# Patient Record
Sex: Male | Born: 1965
Health system: Southern US, Community
[De-identification: ages and names within clinical notes are randomized; demographics above are authoritative.]

## PROBLEM LIST (undated history)

## (undated) DIAGNOSIS — I10 Essential (primary) hypertension: Secondary | ICD-10-CM

---

## 2003-05-06 ENCOUNTER — Encounter (INDEPENDENT_AMBULATORY_CARE_PROVIDER_SITE_OTHER): Payer: Self-pay | Admitting: Specialist

## 2003-05-06 ENCOUNTER — Ambulatory Visit (HOSPITAL_COMMUNITY): Admission: RE | Admit: 2003-05-06 | Discharge: 2003-05-06 | Payer: Self-pay | Admitting: Gastroenterology

## 2003-06-29 ENCOUNTER — Emergency Department (HOSPITAL_COMMUNITY): Admission: EM | Admit: 2003-06-29 | Discharge: 2003-06-29 | Payer: Self-pay | Admitting: Emergency Medicine

## 2009-09-11 ENCOUNTER — Emergency Department (HOSPITAL_COMMUNITY)
Admission: EM | Admit: 2009-09-11 | Discharge: 2009-09-11 | Payer: Self-pay | Source: Home / Self Care | Admitting: Emergency Medicine

## 2010-09-08 NOTE — Op Note (Signed)
[name], Russell English                             ACCOUNT NO.:  [id number]   MEDICAL RECORD NO.:  [id number]                   PATIENT TYPE:  AMB   LOCATION:  ENDO                                 FACILITY:  [facility]   PHYSICIAN:  Petra Kuba, M.D.                 DATE OF BIRTH:  [birth year]   DATE OF PROCEDURE:  DATE OF DISCHARGE:                                 OPERATIVE REPORT   PROCEDURE:  Esophagogastroduodenoscopy with biopsy.   INDICATIONS FOR PROCEDURE:  A patient with abdominal pain, highly abnormal  upper GI want to confirm endoscopically.   Consent was signed after risks, benefits, methods, and options were  thoroughly discussed in the office with both the patient and his wife.   MEDICINES USED:  Demerol 50, Versed 6.   DESCRIPTION OF PROCEDURE:  The video endoscope was inserted by direct  vision.  The esophagus was normal.  In the distal esophagus was a small  hiatal hernia.  The scope passed into the stomach, advanced through a normal  pylorus into an essentially normal duodenal bulb around the C loop into a  normal second portion of the duodenum except for maybe some minimal  duodenitis.  The scope was slowly withdrawn back to the bulb and again other  than some very minimal inflammation, no abnormality was seen. The scope was  withdrawn back to the stomach and retroflexed. The cardia, fundus,  angularis, lesser and greater curve were normal except for some minimal  gastritis.  Straight visualization of the stomach confirmed some minimal  gastritis, ruled out any other additional findings.  A few biopsies of the  antrum and a few of the lesser and greater curve were obtained to rule out  helicobacter or other microscopic abnormalities.  We went ahead and  readvanced into the second portion of the duodenum, no additional findings  were seen. At this juncture, we took two biopsies of the second portion of  the duodenum to rule out any microscopic abnormalities, air was  suctioned,  scope slowly withdrawn. Again a good look at the esophagus on slow  withdrawal was normal, scope was removed. The patient tolerated the  procedure well. There was no obvious or immediate complication.   ENDOSCOPIC DIAGNOSIS:  1. Small hiatal hernia.  2. Minimal gastritis, duodenitis status post biopsies of both.  3. Otherwise within normal limits esophagogastroduodenoscopy.   PLAN:  Await pathology, continue pump inhibitors, okay to stop Carafate.  Followup p.r.n. or in two months. Recheck symptoms and make sure no further  workup plans are needed.  One might consider a small bowel follow through or  CAT scan next depending on his symptoms, although I do think the patient is  better and certainly his exam today is fine.  Petra Kuba, M.D.    MEM/MEDQ  D:  05/06/2003  T:  05/06/2003  Job:  [id number]   cc:   Caryn Bee L. Little, M.D.  [address]  [place]  [place] [address]  Fax: [phone]

## 2011-04-21 ENCOUNTER — Ambulatory Visit (INDEPENDENT_AMBULATORY_CARE_PROVIDER_SITE_OTHER): Payer: 59

## 2011-04-21 DIAGNOSIS — Z23 Encounter for immunization: Secondary | ICD-10-CM

## 2011-04-21 DIAGNOSIS — K602 Anal fissure, unspecified: Secondary | ICD-10-CM

## 2011-04-21 DIAGNOSIS — K6289 Other specified diseases of anus and rectum: Secondary | ICD-10-CM

## 2011-12-22 ENCOUNTER — Ambulatory Visit (INDEPENDENT_AMBULATORY_CARE_PROVIDER_SITE_OTHER): Payer: 59 | Admitting: Family Medicine

## 2011-12-22 VITALS — BP 142/76 | HR 52 | Temp 98.5°F | Resp 16 | Ht 66.0 in | Wt 165.4 lb

## 2011-12-22 DIAGNOSIS — R5383 Other fatigue: Secondary | ICD-10-CM

## 2011-12-22 DIAGNOSIS — R51 Headache: Secondary | ICD-10-CM

## 2011-12-22 DIAGNOSIS — R5381 Other malaise: Secondary | ICD-10-CM

## 2011-12-22 LAB — POCT CBC
Granulocyte percent: 34.5 %G — AB (ref 37–80)
HCT, POC: 46.6 % (ref 43.5–53.7)
Hemoglobin: 14.2 g/dL (ref 14.1–18.1)
Lymph, poc: 4 — AB (ref 0.6–3.4)
MCH, POC: 30 pg (ref 27–31.2)
MCHC: 30.5 g/dL — AB (ref 31.8–35.4)
MCV: 98.5 fL — AB (ref 80–97)
MID (cbc): 0.7 (ref 0–0.9)
MPV: 8.6 fL (ref 0–99.8)
POC Granulocyte: 2.5 (ref 2–6.9)
POC LYMPH PERCENT: 55.5 %L — AB (ref 10–50)
POC MID %: 10 %M (ref 0–12)
Platelet Count, POC: 343 10*3/uL (ref 142–424)
RBC: 4.73 M/uL (ref 4.69–6.13)
RDW, POC: 13.6 %
WBC: 7.2 10*3/uL (ref 4.6–10.2)

## 2011-12-22 LAB — GLUCOSE, POCT (MANUAL RESULT ENTRY): POC Glucose: 86 mg/dl (ref 70–99)

## 2011-12-22 NOTE — Progress Notes (Signed)
  Subjective:    Patient ID: Russell English, male    DOB: 01-Feb-1966, 46 y.o.   MRN: [id number]  HPI Patient presents with one day history of fatigue and dizziness. Working 10-12 hour shifts five to six days a week at [employer]. He has been fatigued after work. He is from [place], speaks good [place]. He is here with his wife.   No cough, fever, sorethroat, abdominal symptoms or trauma  Review of Systems Negative as noted.    Objective:   Physical Exam Well developed and well nourished male in no acute distress. Lungs clear bilaterally abdomen soft and non tender. Lower extremities not swollen. Heart:  Regular, no murmur  Abdomen:  Soft, nontender without HSM or mass Extrem:  Normal distal pulses, no edema Skin:  clear Seen with wife who is concerned about him Results for orders placed in visit on 12/22/11  POCT CBC      Component Value Range   WBC 7.2  4.6 - 10.2 K/uL   Lymph, poc 4.0 (*) 0.6 - 3.4   POC LYMPH PERCENT 55.5 (*) 10 - 50 %L   MID (cbc) 0.7  0 - 0.9   POC MID % 10.0  0 - 12 %M   POC Granulocyte 2.5  2 - 6.9   Granulocyte percent 34.5 (*) 37 - 80 %G   RBC 4.73  4.69 - 6.13 M/uL   Hemoglobin 14.2  14.1 - 18.1 g/dL   HCT, POC 40.9  81.1 - 53.7 %   MCV 98.5 (*) 80 - 97 fL   MCH, POC 30.0  27 - 31.2 pg   MCHC 30.5 (*) 31.8 - 35.4 g/dL   RDW, POC 91.4     Platelet Count, POC 343  142 - 424 K/uL   MPV 8.6  0 - 99.8 fL  GLUCOSE, POCT (MANUAL RESULT ENTRY)      Component Value Range   POC Glucose 86  70 - 99 mg/dl        Assessment & Plan:  Tired from working too many hours.

## 2012-04-22 ENCOUNTER — Ambulatory Visit (INDEPENDENT_AMBULATORY_CARE_PROVIDER_SITE_OTHER): Payer: 59 | Admitting: Family Medicine

## 2012-04-22 VITALS — BP 152/88 | HR 83 | Temp 99.0°F | Resp 16 | Ht 67.0 in | Wt 170.8 lb

## 2012-04-22 DIAGNOSIS — D239 Other benign neoplasm of skin, unspecified: Secondary | ICD-10-CM

## 2012-04-22 DIAGNOSIS — D229 Melanocytic nevi, unspecified: Secondary | ICD-10-CM

## 2012-04-22 DIAGNOSIS — Z720 Tobacco use: Secondary | ICD-10-CM

## 2012-04-22 DIAGNOSIS — I10 Essential (primary) hypertension: Secondary | ICD-10-CM

## 2012-04-22 MED ORDER — HYDROCHLOROTHIAZIDE 25 MG PO TABS: 12.5000 mg | ORAL_TABLET | Freq: Every day | ORAL | Status: DC

## 2012-04-22 NOTE — Progress Notes (Signed)
Subjective: Patient is here for a couple of issues. His blood pressure was high somewhere couple of months ago when get checked he wants that rechecked. He has a mole left side of his scalp he wants to get off some time.  Objective: Chest clear heart regular without murmurs blood pressure is noted as a 8 mm diameter nevus on the left side of his scalp/temple area that is totally benign. This is not something that can be frozen off.  Assessment: Nevus on scalp Hypertension Tobacco use  Plan: Did have a long discussion about cigarettes with both him and his wife. hydrochlorothiazide 12.5 mg daily She is to call back sometime and asked that I can meet him at 104 to do a shave biopsy and cautery of that nevus.

## 2012-04-22 NOTE — Patient Instructions (Signed)
Avoid excessive salt  Drink plenty of fluids  Call and leave a message for me to schedule a time that I can't take the skin lesions off your temple

## 2015-05-24 ENCOUNTER — Ambulatory Visit (INDEPENDENT_AMBULATORY_CARE_PROVIDER_SITE_OTHER): Payer: 59 | Admitting: Internal Medicine

## 2015-05-24 ENCOUNTER — Encounter: Payer: Self-pay | Admitting: Physician Assistant

## 2015-05-24 VITALS — BP 138/80 | HR 93 | Temp 98.6°F | Resp 16 | Ht 66.0 in | Wt 172.0 lb

## 2015-05-24 DIAGNOSIS — R509 Fever, unspecified: Secondary | ICD-10-CM

## 2015-05-24 DIAGNOSIS — D72829 Elevated white blood cell count, unspecified: Secondary | ICD-10-CM

## 2015-05-24 DIAGNOSIS — R6889 Other general symptoms and signs: Secondary | ICD-10-CM | POA: Diagnosis not present

## 2015-05-24 LAB — POCT CBC
GRANULOCYTE PERCENT: 79 % (ref 37–80)
HEMATOCRIT: 41.1 % — AB (ref 43.5–53.7)
HEMOGLOBIN: 14.4 g/dL (ref 14.1–18.1)
LYMPH, POC: 2.7 (ref 0.6–3.4)
MCH, POC: 32.5 pg — AB (ref 27–31.2)
MCHC: 34.9 g/dL (ref 31.8–35.4)
MCV: 93 fL (ref 80–97)
MID (cbc): 0.6 (ref 0–0.9)
MPV: 7.1 fL (ref 0–99.8)
PLATELET COUNT, POC: 306 10*3/uL (ref 142–424)
POC GRANULOCYTE: 12.5 — AB (ref 2–6.9)
POC LYMPH PERCENT: 17.3 %L (ref 10–50)
POC MID %: 3.7 %M (ref 0–12)
RBC: 4.42 M/uL — AB (ref 4.69–6.13)
RDW, POC: 13.7 %
WBC: 15.8 10*3/uL — AB (ref 4.6–10.2)

## 2015-05-24 LAB — POCT INFLUENZA A/B
Influenza A, POC: NEGATIVE
Influenza B, POC: NEGATIVE

## 2015-05-24 MED ORDER — HYDROCOD POLST-CPM POLST ER 10-8 MG/5ML PO SUER
5.0000 mL | Freq: Two times a day (BID) | ORAL | Status: DC | PRN
Start: 2015-05-24 — End: 2018-03-26

## 2015-05-24 MED ORDER — AZITHROMYCIN 250 MG PO TABS: ORAL_TABLET | ORAL | Status: AC

## 2015-05-24 MED ORDER — IPRATROPIUM BROMIDE 0.03 % NA SOLN: 2.0000 | Freq: Two times a day (BID) | NASAL | Status: DC

## 2015-05-24 NOTE — Patient Instructions (Signed)
Take antibiotic as directed. Drink plenty of water (64 oz/day) and get plenty of rest. If you have been prescribed a cough syrup, do not drive or operate heavy machinery while using this medication. If you have been prescribed a nasal spray, use twice a day. If your symptoms are not improving in 1 week, return to clinic.

## 2015-05-24 NOTE — Progress Notes (Signed)
[facility] [address], [place] [address] [phone]  Date:  05/24/2015   Name:  Russell English   DOB:  [birth year]   MRN:  [id number]  PCP:  No primary care provider on file.    Chief Complaint: Fever; Generalized Body Aches; Sore Throat; and Cough   History of Present Illness:  This is a 50 y.o. male with PMH HTN who is presenting with fever, body aches, headache, cough, sore throat, otalgia x 4 days. Cough is dry. No sob or wheezing. Checking temp at home and high of 102. Last took ibuprofen 3 hours ago and temp now 101.9.  Aggravating/alleviating factors: taking ibuprofen and theraflu History of asthma: no History of env allergies: yes Tobacco use: 1 ppd Did not get flu shot this year.  Review of Systems:  Review of Systems See HPI  There are no active problems to display for this patient.   Prior to Admission medications   Medication Sig Start Date End Date Taking? Authorizing Provider  hydrochlorothiazide (HYDRODIURIL) 25 MG tablet Take 0.5 tablets (12.5 mg total) by mouth daily. 04/22/12  Yes Posey Boyer, MD    No Known Allergies  History reviewed. No pertinent past surgical history.  Social History  Substance Use Topics  . Smoking status: Current Every Day Smoker  . Smokeless tobacco: Never Used  . Alcohol Use: No    History reviewed. No pertinent family history.  Medication list has been reviewed and updated.  Physical Examination:  Physical Exam  Constitutional: He is oriented to person, place, and time. He appears well-developed and well-nourished. No distress.  HENT:  Head: Normocephalic and atraumatic.  Right Ear: Hearing, tympanic membrane, external ear and ear canal normal.  Left Ear: Hearing, tympanic membrane, external ear and ear canal normal.  Nose: Nose normal.  Mouth/Throat: Uvula is midline and mucous membranes are normal. Posterior oropharyngeal erythema present. No oropharyngeal exudate or posterior  oropharyngeal edema.  Eyes: Conjunctivae and lids are normal. Right eye exhibits no discharge. Left eye exhibits no discharge. No scleral icterus.  Cardiovascular: Normal rate, regular rhythm, normal heart sounds and normal pulses.   No murmur heard. Pulmonary/Chest: Effort normal and breath sounds normal. No respiratory distress. He has no wheezes. He has no rhonchi. He has no rales.  Musculoskeletal: Normal range of motion.  Lymphadenopathy:       Head (right side): No submental, no submandibular and no tonsillar adenopathy present.       Head (left side): No submental, no submandibular and no tonsillar adenopathy present.    He has no cervical adenopathy.  Neurological: He is alert and oriented to person, place, and time.  Skin: Skin is warm, dry and intact. No lesion and no rash noted.  Psychiatric: He has a normal mood and affect. His speech is normal and behavior is normal. Thought content normal.    BP 138/80 mmHg  Pulse 93  Temp(Src) 101.9 F (38.8 C) (Oral)  Resp 16  Ht 5\' 6"  (1.676 m)  Wt 172 lb (78.019 kg)  BMI 27.77 kg/m2  SpO2 98%  Temp came down to 98.6 thirty minutes after given 1 gm tylenol  Results for orders placed or performed in visit on 05/24/15  POCT Influenza A/B  Result Value Ref Range   Influenza A, POC Negative Negative   Influenza B, POC Negative Negative  POCT CBC  Result Value Ref Range   WBC 15.8 (A) 4.6 - 10.2 K/uL   Lymph, poc 2.7  0.6 - 3.4   POC LYMPH PERCENT 17.3 10 - 50 %L   MID (cbc) 0.6 0 - 0.9   POC MID % 3.7 0 - 12 %M   POC Granulocyte 12.5 (A) 2 - 6.9   Granulocyte percent 79.0 37 - 80 %G   RBC 4.42 (A) 4.69 - 6.13 M/uL   Hemoglobin 14.4 14.1 - 18.1 g/dL   HCT, POC 41.1 (A) 43.5 - 53.7 %   MCV 93.0 80 - 97 fL   MCH, POC 32.5 (A) 27 - 31.2 pg   MCHC 34.9 31.8 - 35.4 g/dL   RDW, POC 13.7 %   Platelet Count, POC 306 142 - 424 K/uL   MPV 7.1 0 - 99.8 fL    Assessment and Plan:  1. Flu-like symptoms 2. Fever, unspecified 3.  leukocytosis Flu negative. Leukocytosis possibly d/t reactive process but will cover with zpak. Otherwise, focus is on supportive care, see meds prescribed below. Return in 1 if symptoms do not improve or at any time if symptoms worsen. Return if still have high fevers in 72 hours.  - ipratropium (ATROVENT) 0.03 % nasal spray; Place 2 sprays into both nostrils 2 (two) times daily.  Dispense: 30 mL; Refill: 0 - chlorpheniramine-HYDROcodone (TUSSIONEX PENNKINETIC ER) 10-8 MG/5ML SUER; Take 5 mLs by mouth every 12 (twelve) hours as needed for cough.  Dispense: 100 mL; Refill: 0 - POCT Influenza A/B - POCT CBC - azithromycin (ZITHROMAX) 250 MG tablet; Take 2 tabs PO x 1 dose, then 1 tab PO QD x 4 days  Dispense: 6 tablet; Refill: 0  Discussed case with Dr. Awanda Mink V. Drenda Freeze, MHS [facility]  05/27/2015 I have participated in the care of this patient with the Advanced Practice Provider and agree with Diagnosis and Plan as documented. Robert P. Laney Pastor, M.D.

## 2017-03-13 ENCOUNTER — Encounter: Payer: Self-pay | Admitting: Emergency Medicine

## 2017-03-13 ENCOUNTER — Ambulatory Visit (INDEPENDENT_AMBULATORY_CARE_PROVIDER_SITE_OTHER): Payer: 59 | Admitting: Emergency Medicine

## 2017-03-13 VITALS — BP 132/80 | HR 75 | Temp 98.9°F | Resp 17 | Ht 66.5 in | Wt 165.0 lb

## 2017-03-13 DIAGNOSIS — I10 Essential (primary) hypertension: Secondary | ICD-10-CM | POA: Diagnosis not present

## 2017-03-13 DIAGNOSIS — J069 Acute upper respiratory infection, unspecified: Secondary | ICD-10-CM | POA: Diagnosis not present

## 2017-03-13 DIAGNOSIS — R05 Cough: Secondary | ICD-10-CM

## 2017-03-13 DIAGNOSIS — R059 Cough, unspecified: Secondary | ICD-10-CM | POA: Insufficient documentation

## 2017-03-13 NOTE — Progress Notes (Signed)
Russell English 51 y.o.   Chief Complaint  Patient presents with  . Cough  . URI    HISTORY OF PRESENT ILLNESS: This is a 51 y.o. male complaining of 3-4 day h/o cough and congestion.  Cough  This is a new problem. The current episode started in the past 7 days. The problem has been waxing and waning. The problem occurs every few minutes. The cough is non-productive. Associated symptoms include nasal congestion. Pertinent negatives include no chest pain, ear pain, eye redness, fever, heartburn, hemoptysis, myalgias, rash, rhinorrhea, sore throat, shortness of breath, weight loss or wheezing.  URI   This is a new problem. The current episode started in the past 7 days. The problem has been waxing and waning. There has been no fever. Associated symptoms include congestion and coughing. Pertinent negatives include no chest pain, dysuria, ear pain, rash, rhinorrhea, sore throat or wheezing. He has tried nothing for the symptoms.     Prior to Admission medications   Medication Sig Start Date End Date Taking? Authorizing Provider  chlorpheniramine-HYDROcodone (TUSSIONEX PENNKINETIC ER) 10-8 MG/5ML SUER Take 5 mLs by mouth every 12 (twelve) hours as needed for cough. 05/24/15  Yes Ezekiel Slocumb, PA-C  hydrochlorothiazide (HYDRODIURIL) 25 MG tablet Take 0.5 tablets (12.5 mg total) by mouth daily. 04/22/12  Yes Posey Boyer, MD  ipratropium (ATROVENT) 0.03 % nasal spray Place 2 sprays into both nostrils 2 (two) times daily. 05/24/15  Yes Ezekiel Slocumb, PA-C    No Known Allergies  There are no active problems to display for this patient.   No past medical history on file.  No past surgical history on file.  Social History   Socioeconomic History  . Marital status: Single    Spouse name: Not on file  . Number of children: Not on file  . Years of education: Not on file  . Highest education level: Not on file  Social Needs  . Financial resource strain: Not on file  . Food insecurity -  worry: Not on file  . Food insecurity - inability: Not on file  . Transportation needs - medical: Not on file  . Transportation needs - non-medical: Not on file  Occupational History  . Not on file  Tobacco Use  . Smoking status: Current Every Day Smoker  . Smokeless tobacco: Never Used  Substance and Sexual Activity  . Alcohol use: No    Alcohol/week: 0.0 oz  . Drug use: No  . Sexual activity: No  Other Topics Concern  . Not on file  Social History Narrative  . Not on file    No family history on file.   Review of Systems  Constitutional: Negative for fever and weight loss.  HENT: Positive for congestion. Negative for ear pain, nosebleeds, rhinorrhea and sore throat.   Eyes: Negative for discharge and redness.  Respiratory: Positive for cough. Negative for hemoptysis, shortness of breath and wheezing.   Cardiovascular: Negative.  Negative for chest pain.  Gastrointestinal: Negative for heartburn.  Genitourinary: Negative for dysuria and hematuria.  Musculoskeletal: Negative for myalgias.  Skin: Negative.  Negative for rash.  All other systems reviewed and are negative.   Vitals:   03/13/17 1342  BP: 132/80  Pulse: 75  Resp: 17  Temp: 98.9 F (37.2 C)  SpO2: 98%    Physical Exam  Constitutional: He is oriented to person, place, and time. He appears well-developed and well-nourished.  HENT:  Head: Normocephalic and atraumatic.  Right Ear: External  ear normal.  Left Ear: External ear normal.  Nose: Nose normal.  Mouth/Throat: Oropharynx is clear and moist.  Eyes: Conjunctivae and EOM are normal. Pupils are equal, round, and reactive to light.  Neck: Normal range of motion. Neck supple. No JVD present. No thyromegaly present.  Cardiovascular: Normal rate, regular rhythm, normal heart sounds and intact distal pulses.  Pulmonary/Chest: Effort normal and breath sounds normal.  Abdominal: Soft. Bowel sounds are normal. He exhibits no distension. There is no  tenderness.  Musculoskeletal: Normal range of motion.  Lymphadenopathy:    He has no cervical adenopathy.  Neurological: He is alert and oriented to person, place, and time. No sensory deficit. He exhibits normal muscle tone.  Skin: Skin is warm and dry. Capillary refill takes less than 2 seconds. No rash noted.  Psychiatric: He has a normal mood and affect. His behavior is normal.  Vitals reviewed.    ASSESSMENT & PLAN: Russell English was seen today for cough and uri.  Diagnoses and all orders for this visit:  Acute upper respiratory infection  Cough  Essential hypertension    Patient Instructions       IF you received an x-ray today, you will receive an invoice from [facility]. Please contact [facility] at [phone] with questions or concerns regarding your invoice.   IF you received labwork today, you will receive an invoice from [facility]. Please contact [facility] at [phone] with questions or concerns regarding your invoice.   Our billing staff will not be able to assist you with questions regarding bills from these companies.  You will be contacted with the lab results as soon as they are available. The fastest way to get your results is to activate your My Chart account. Instructions are located on the last page of this paperwork. If you have not heard from [place] regarding the results in 2 weeks, please contact this office.      Upper Respiratory Infection, Adult Most upper respiratory infections (URIs) are caused by a virus. A URI affects the nose, throat, and upper air passages. The most common type of URI is often called "the common cold." Follow these instructions at home:  Take medicines only as told by your doctor.  Gargle warm saltwater or take cough drops to comfort your throat as told by your doctor.  Use a warm mist humidifier or inhale steam from a shower to increase air moisture. This may make it easier to breathe.  Drink enough fluid  to keep your pee (urine) clear or pale yellow.  Eat soups and other clear broths.  Have a healthy diet.  Rest as needed.  Go back to work when your fever is gone or your doctor says it is okay. ? You may need to stay home longer to avoid giving your URI to others. ? You can also wear a face mask and wash your hands often to prevent spread of the virus.  Use your inhaler more if you have asthma.  Do not use any tobacco products, including cigarettes, chewing tobacco, or electronic cigarettes. If you need help quitting, ask your doctor. Contact a doctor if:  You are getting worse, not better.  Your symptoms are not helped by medicine.  You have chills.  You are getting more short of breath.  You have brown or red mucus.  You have yellow or brown discharge from your nose.  You have pain in your face, especially when you bend forward.  You have a fever.  You have  puffy (swollen) neck glands.  You have pain while swallowing.  You have white areas in the back of your throat. Get help right away if:  You have very bad or constant: ? Headache. ? Ear pain. ? Pain in your forehead, behind your eyes, and over your cheekbones (sinus pain). ? Chest pain.  You have long-lasting (chronic) lung disease and any of the following: ? Wheezing. ? Long-lasting cough. ? Coughing up blood. ? A change in your usual mucus.  You have a stiff neck.  You have changes in your: ? Vision. ? Hearing. ? Thinking. ? Mood. This information is not intended to replace advice given to you by your health care provider. Make sure you discuss any questions you have with your health care provider. Document Released: 09/26/2007 Document Revised: 12/11/2015 Document Reviewed: 07/15/2013 Elsevier Interactive Patient Education  2018 Elsevier Inc.     Agustina Caroli, MD [facility]

## 2017-03-13 NOTE — Patient Instructions (Addendum)
     IF you received an x-ray today, you will receive an invoice from [facility]. Please contact [facility] at [phone] with questions or concerns regarding your invoice.   IF you received labwork today, you will receive an invoice from [facility]. Please contact [facility] at [phone] with questions or concerns regarding your invoice.   Our billing staff will not be able to assist you with questions regarding bills from these companies.  You will be contacted with the lab results as soon as they are available. The fastest way to get your results is to activate your My Chart account. Instructions are located on the last page of this paperwork. If you have not heard from us regarding the results in 2 weeks, please contact this office.     Upper Respiratory Infection, Adult Most upper respiratory infections (URIs) are caused by a virus. A URI affects the nose, throat, and upper air passages. The most common type of URI is often called "the common cold." Follow these instructions at home:  Take medicines only as told by your doctor.  Gargle warm saltwater or take cough drops to comfort your throat as told by your doctor.  Use a warm mist humidifier or inhale steam from a shower to increase air moisture. This may make it easier to breathe.  Drink enough fluid to keep your pee (urine) clear or pale yellow.  Eat soups and other clear broths.  Have a healthy diet.  Rest as needed.  Go back to work when your fever is gone or your doctor says it is okay. ? You may need to stay home longer to avoid giving your URI to others. ? You can also wear a face mask and wash your hands often to prevent spread of the virus.  Use your inhaler more if you have asthma.  Do not use any tobacco products, including cigarettes, chewing tobacco, or electronic cigarettes. If you need help quitting, ask your doctor. Contact a doctor if:  You are getting worse, not better.  Your  symptoms are not helped by medicine.  You have chills.  You are getting more short of breath.  You have brown or red mucus.  You have yellow or brown discharge from your nose.  You have pain in your face, especially when you bend forward.  You have a fever.  You have puffy (swollen) neck glands.  You have pain while swallowing.  You have white areas in the back of your throat. Get help right away if:  You have very bad or constant: ? Headache. ? Ear pain. ? Pain in your forehead, behind your eyes, and over your cheekbones (sinus pain). ? Chest pain.  You have long-lasting (chronic) lung disease and any of the following: ? Wheezing. ? Long-lasting cough. ? Coughing up blood. ? A change in your usual mucus.  You have a stiff neck.  You have changes in your: ? Vision. ? Hearing. ? Thinking. ? Mood. This information is not intended to replace advice given to you by your health care provider. Make sure you discuss any questions you have with your health care provider. Document Released: 09/26/2007 Document Revised: 12/11/2015 Document Reviewed: 07/15/2013 Elsevier Interactive Patient Education  2018 Elsevier Inc.  

## 2017-09-09 DIAGNOSIS — M7540 Impingement syndrome of unspecified shoulder: Secondary | ICD-10-CM | POA: Diagnosis not present

## 2017-09-09 DIAGNOSIS — M25512 Pain in left shoulder: Secondary | ICD-10-CM | POA: Diagnosis not present

## 2018-01-16 ENCOUNTER — Other Ambulatory Visit: Payer: Self-pay

## 2018-01-16 ENCOUNTER — Encounter (HOSPITAL_COMMUNITY): Payer: Self-pay | Admitting: Emergency Medicine

## 2018-01-16 DIAGNOSIS — F17210 Nicotine dependence, cigarettes, uncomplicated: Secondary | ICD-10-CM | POA: Diagnosis not present

## 2018-01-16 DIAGNOSIS — S299XXA Unspecified injury of thorax, initial encounter: Secondary | ICD-10-CM | POA: Diagnosis not present

## 2018-01-16 DIAGNOSIS — M25562 Pain in left knee: Secondary | ICD-10-CM | POA: Insufficient documentation

## 2018-01-16 DIAGNOSIS — Z79899 Other long term (current) drug therapy: Secondary | ICD-10-CM | POA: Diagnosis not present

## 2018-01-16 DIAGNOSIS — M25512 Pain in left shoulder: Secondary | ICD-10-CM | POA: Insufficient documentation

## 2018-01-16 DIAGNOSIS — S8992XA Unspecified injury of left lower leg, initial encounter: Secondary | ICD-10-CM | POA: Diagnosis not present
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
## 2018-01-17 ENCOUNTER — Emergency Department (HOSPITAL_COMMUNITY)
Admission: EM | Admit: 2018-01-17 | Discharge: 2018-01-17 | Disposition: A | Discharge status: Home / Self Care | Payer: 59 | Attending: Emergency Medicine | Admitting: Emergency Medicine

## 2018-01-17 ENCOUNTER — Emergency Department (HOSPITAL_COMMUNITY): Payer: 59

## 2018-01-17 ENCOUNTER — Encounter (HOSPITAL_COMMUNITY): Payer: Self-pay | Admitting: Emergency Medicine

## 2018-01-17 DIAGNOSIS — S8992XA Unspecified injury of left lower leg, initial encounter: Secondary | ICD-10-CM | POA: Diagnosis not present

## 2018-01-17 DIAGNOSIS — S299XXA Unspecified injury of thorax, initial encounter: Secondary | ICD-10-CM | POA: Diagnosis not present

## 2018-01-17 DIAGNOSIS — M25512 Pain in left shoulder: Secondary | ICD-10-CM | POA: Diagnosis not present

## 2018-01-17 HISTORY — DX: Essential (primary) hypertension: I10

## 2018-01-17 MED ORDER — OXYCODONE-ACETAMINOPHEN 5-325 MG PO TABS
2.0000 | ORAL_TABLET | ORAL | Status: DC | PRN
  Administered 2018-01-17: 2 via ORAL
  Filled 2018-01-17: qty 2

## 2018-01-17 MED ORDER — IBUPROFEN 800 MG PO TABS: 800.0000 mg | ORAL_TABLET | Freq: Three times a day (TID) | ORAL | 0 refills | Status: DC

## 2018-01-17 MED ORDER — LIDOCAINE 5 % EX PTCH: 1.0000 | MEDICATED_PATCH | CUTANEOUS | 0 refills | Status: DC

## 2018-01-17 MED ORDER — IBUPROFEN 800 MG PO TABS
800.0000 mg | ORAL_TABLET | Freq: Once | ORAL | Status: AC
  Administered 2018-01-17: 800 mg via ORAL
  Filled 2018-01-17: qty 1
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
## 2018-03-21 ENCOUNTER — Ambulatory Visit: Payer: 59 | Admitting: Emergency Medicine

## 2018-03-26 ENCOUNTER — Encounter: Payer: Self-pay | Admitting: Family Medicine

## 2018-03-26 ENCOUNTER — Other Ambulatory Visit: Payer: Self-pay

## 2018-03-26 ENCOUNTER — Ambulatory Visit (INDEPENDENT_AMBULATORY_CARE_PROVIDER_SITE_OTHER): Payer: 59 | Admitting: Family Medicine

## 2018-03-26 VITALS — BP 140/72 | HR 62 | Temp 98.6°F | Resp 17 | Ht 67.0 in | Wt 180.4 lb

## 2018-03-26 DIAGNOSIS — R6889 Other general symptoms and signs: Secondary | ICD-10-CM

## 2018-03-26 DIAGNOSIS — M791 Myalgia, unspecified site: Secondary | ICD-10-CM | POA: Diagnosis not present

## 2018-03-26 DIAGNOSIS — Z760 Encounter for issue of repeat prescription: Secondary | ICD-10-CM

## 2018-03-26 DIAGNOSIS — I10 Essential (primary) hypertension: Secondary | ICD-10-CM | POA: Diagnosis not present

## 2018-03-26 DIAGNOSIS — Z125 Encounter for screening for malignant neoplasm of prostate: Secondary | ICD-10-CM | POA: Diagnosis not present

## 2018-03-26 DIAGNOSIS — Z131 Encounter for screening for diabetes mellitus: Secondary | ICD-10-CM

## 2018-03-26 DIAGNOSIS — F17200 Nicotine dependence, unspecified, uncomplicated: Secondary | ICD-10-CM

## 2018-03-26 MED ORDER — IPRATROPIUM BROMIDE 0.03 % NA SOLN: 2.0000 | Freq: Two times a day (BID) | NASAL | 0 refills | Status: DC

## 2018-03-26 MED ORDER — HYDROCHLOROTHIAZIDE 25 MG PO TABS: 12.5000 mg | ORAL_TABLET | Freq: Every day | ORAL | 5 refills | Status: DC

## 2018-03-26 MED ORDER — IBUPROFEN 800 MG PO TABS: 800.0000 mg | ORAL_TABLET | Freq: Three times a day (TID) | ORAL | 0 refills | Status: DC
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
## 2018-03-26 NOTE — Patient Instructions (Signed)
° ° ° °  If you have lab work done today you will be contacted with your lab results within the next 2 weeks.  If you have not heard from us then please contact us. The fastest way to get your results is to register for My Chart. ° ° °IF you received an x-ray today, you will receive an invoice from [facility]. Please contact [facility] at [phone] with questions or concerns regarding your invoice.  ° °IF you received labwork today, you will receive an invoice from [facility]. Please contact [facility] at [phone] with questions or concerns regarding your invoice.  ° °Our billing staff will not be able to assist you with questions regarding bills from these companies. ° °You will be contacted with the lab results as soon as they are available. The fastest way to get your results is to activate your My Chart account. Instructions are located on the last page of this paperwork. If you have not heard from us regarding the results in 2 weeks, please contact this office. °  ° ° ° °

## 2018-03-27 ENCOUNTER — Encounter: Payer: Self-pay | Admitting: Family Medicine

## 2018-03-27 LAB — CMP14+EGFR
ALT: 32 IU/L (ref 0–44)
AST: 23 IU/L (ref 0–40)
Albumin/Globulin Ratio: 2.2 (ref 1.2–2.2)
Albumin: 5.3 g/dL (ref 3.5–5.5)
Alkaline Phosphatase: 95 IU/L (ref 39–117)
BILIRUBIN TOTAL: 0.4 mg/dL (ref 0.0–1.2)
BUN / CREAT RATIO: 12 (ref 9–20)
BUN: 12 mg/dL (ref 6–24)
CALCIUM: 10.1 mg/dL (ref 8.7–10.2)
CO2: 19 mmol/L — ABNORMAL LOW (ref 20–29)
Chloride: 105 mmol/L (ref 96–106)
Creatinine, Ser: 1.03 mg/dL (ref 0.76–1.27)
GFR, EST AFRICAN AMERICAN: 95 mL/min/{1.73_m2} (ref >59)
GFR, EST NON AFRICAN AMERICAN: 83 mL/min/{1.73_m2} (ref >59)
GLUCOSE: 104 mg/dL — AB (ref 65–99)
Globulin, Total: 2.4 g/dL (ref 1.5–4.5)
Potassium: 4.2 mmol/L (ref 3.5–5.2)
Sodium: 145 mmol/L — ABNORMAL HIGH (ref 134–144)
TOTAL PROTEIN: 7.7 g/dL (ref 6.0–8.5)

## 2018-03-27 LAB — PSA: Prostate Specific Ag, Serum: 0.8 ng/mL (ref 0.0–4.0)

## 2018-03-27 LAB — HEMOGLOBIN A1C
Est. average glucose Bld gHb Est-mCnc: 111 mg/dL
Hgb A1c MFr Bld: 5.5 % (ref 4.8–5.6)

## 2018-03-27 NOTE — Addendum Note (Signed)
Addended by: Delia Chimes A on: 03/27/2018 07:44 PM   Modules accepted: Level of Service

## 2018-04-01 ENCOUNTER — Ambulatory Visit: Payer: Self-pay

## 2018-04-01 NOTE — Telephone Encounter (Signed)
Pt. Called to report elevated BP; 167/111, pulse 114 @ 10:00 AM; BP 157/102, pulse 104 @ 12:00 PM.  C/o right sided chest pain that radiates up to the neck; rated pain at 7/10.  Stated the chest pain has been intermittent since yesterday.  Denied shortness of breath.  C/o headache.  Denied blurred vision, weakness of extremities, or slurred speech.  Advised with elevated BP and chest pain, he should go to the ER.  Advised to have someone drive him to the ER.  Pt. Verb. Understanding and agreed to go to ER.       Reason for Disposition . [8] Systolic BP  >= 242 OR Diastolic >= 353 AND [6] cardiac or neurologic symptoms (e.g., chest pain, difficulty breathing, unsteady gait, blurred vision)  Answer Assessment - Initial Assessment Questions 1. BLOOD PRESSURE: "What is the blood pressure?" "Did you take at least two measurements 5 minutes apart?"     BP 167/111, P. 114 @ 10:00 AM, 157/102, P. 104 @ 12:00 PM   2. ONSET: "When did you take your blood pressure?"     See above  3. HOW: "How did you obtain the blood pressure?" (e.g., visiting nurse, automatic home BP monitor)     Digital  4. HISTORY: "Do you have a history of high blood pressure?"     yes 5. MEDICATIONS: "Are you taking any medications for blood pressure?" "Have you missed any doses recently?"     Taking doses as prescribed  6. OTHER SYMPTOMS: "Do you have any symptoms?" (e.g., headache, chest pain, blurred vision, difficulty breathing, weakness)     C/o headache, c/o intermittent right side of chest pain that radiates to neck ; 7/10. ; denied shortness of breath,  Denied blurred vision, denied weakness of extremities  Protocols used: HIGH BLOOD PRESSURE-A-AH

## 2018-04-02 ENCOUNTER — Ambulatory Visit (INDEPENDENT_AMBULATORY_CARE_PROVIDER_SITE_OTHER): Payer: 59 | Admitting: Family Medicine

## 2018-04-02 ENCOUNTER — Other Ambulatory Visit: Payer: Self-pay

## 2018-04-02 ENCOUNTER — Encounter: Payer: Self-pay | Admitting: Family Medicine

## 2018-04-02 VITALS — BP 163/98 | HR 84 | Temp 98.3°F | Resp 18 | Ht 67.44 in | Wt 178.0 lb

## 2018-04-02 DIAGNOSIS — Z720 Tobacco use: Secondary | ICD-10-CM | POA: Diagnosis not present

## 2018-04-02 DIAGNOSIS — I10 Essential (primary) hypertension: Secondary | ICD-10-CM | POA: Diagnosis not present

## 2018-04-02 MED ORDER — AMLODIPINE BESYLATE 5 MG PO TABS: 5.0000 mg | ORAL_TABLET | Freq: Every day | ORAL | 3 refills | Status: DC

## 2018-04-02 NOTE — Patient Instructions (Addendum)
Try to lose a few pounds  Decrease salt intake  Drink plenty of water  Work on tapering off of your cigarettes was on a quit date as discussed, and when she quit them never touched another one.  Decrease your alcohol intake if possible  Get regular exercise  Blood pressures at home.  If on the hydrochlorothiazide 25 mg 1 daily the pressure does not drop down to below 135/80 over the next 2 weeks, then I would go ahead and add the amlodipine 1 daily in addition to the hydrochlorothiazide.  Return in about 3 months to let Dr. Nolon Rod follow-up on your blood pressure.  If you have lab work done today you will be contacted with your lab results within the next 2 weeks.  If you have not heard from [place] then please contact us. The fastest way to get your results is to register for My Chart.   IF you received an x-ray today, you will receive an invoice from [facility]. Please contact [facility] at [phone] with questions or concerns regarding your invoice.   IF you received labwork today, you will receive an invoice from [facility]. Please contact [facility] at [phone] with questions or concerns regarding your invoice.   Our billing staff will not be able to assist you with questions regarding bills from these companies.  You will be contacted with the lab results as soon as they are available. The fastest way to get your results is to activate your My Chart account. Instructions are located on the last page of this paperwork. If you have not heard from [place] regarding the results in 2 weeks, please contact this office.
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
## 2018-06-27 ENCOUNTER — Ambulatory Visit: Payer: 59 | Admitting: Family Medicine

## 2018-07-21 ENCOUNTER — Telehealth (INDEPENDENT_AMBULATORY_CARE_PROVIDER_SITE_OTHER): Payer: 59 | Admitting: Family Medicine

## 2018-07-21 ENCOUNTER — Other Ambulatory Visit: Payer: Self-pay

## 2018-07-21 DIAGNOSIS — I10 Essential (primary) hypertension: Secondary | ICD-10-CM

## 2018-07-21 DIAGNOSIS — Z720 Tobacco use: Secondary | ICD-10-CM | POA: Diagnosis not present

## 2018-07-21 NOTE — Progress Notes (Signed)
Telemedicine Encounter- SOAP NOTE Established Patient  This telephone encounter was conducted with the patient's (or proxy's) verbal consent via audio telecommunications: yes/no: Yes Patient was instructed to have this encounter in a suitably private space; and to only have persons present to whom they give permission to participate. In addition, patient identity was confirmed by use of name plus two identifiers (DOB and address).  I discussed the limitations, risks, security and privacy concerns of performing an evaluation and management service by telephone and the availability of in person appointments. I also discussed with the patient that there may be a patient responsible charge related to this service. The patient expressed understanding and agreed to proceed.  I spent a total of TIME; 0 MIN TO 60 MIN: 20 minutes talking with the patient or their proxy.  CC: hypertension, amlodipine refill  Subjective   Russell English is a 53 y.o. established patient. Telephone visit today for  HPI  Hypertension: Patient here for follow-up of elevated blood pressure. He is exercising and is adherent to low salt diet.  Blood pressure is well controlled at home. With readings of 115-130/70-90s. Cardiac symptoms none. Patient denies chest pain, claudication, exertional chest pressure/discomfort, irregular heart beat and lower extremity edema.  Cardiovascular risk factors: hypertension, male gender and smoking/ tobacco exposure. Use of agents associated with hypertension: none. History of target organ damage: none. BP Readings from Last 3 Encounters:  04/02/18 (!) 163/98  03/26/18 140/72  01/17/18 126/81   Tobacco use Patient smokes 18-20 cigarettes per day Denies cough He has never quit smoking    Patient Active Problem List   Diagnosis Date Noted  . Acute upper respiratory infection 03/13/2017  . Cough 03/13/2017  . Essential hypertension 03/13/2017    Past Medical History:  Diagnosis  Date  . Hypertension     Current Outpatient Medications  Medication Sig Dispense Refill  . amLODipine (NORVASC) 5 MG tablet Take 1 tablet (5 mg total) by mouth daily. 90 tablet 3  . hydrochlorothiazide (HYDRODIURIL) 25 MG tablet Take 0.5 tablets (12.5 mg total) by mouth daily. 30 tablet 5  . ibuprofen (ADVIL,MOTRIN) 800 MG tablet Take 1 tablet (800 mg total) by mouth 3 (three) times daily. 21 tablet 0  . ipratropium (ATROVENT) 0.03 % nasal spray Place 2 sprays into both nostrils 2 (two) times daily. 30 mL 0   No current facility-administered medications for this visit.     No Known Allergies  Social History   Socioeconomic History  . Marital status: Married    Spouse name: Not on file  . Number of children: 2  . Years of education: Not on file  . Highest education level: Not on file  Occupational History  . Not on file  Social Needs  . Financial resource strain: Not on file  . Food insecurity:    Worry: Not on file    Inability: Not on file  . Transportation needs:    Medical: Not on file    Non-medical: Not on file  Tobacco Use  . Smoking status: Current Every Day Smoker    Packs/day: 0.25    Types: Cigarettes  . Smokeless tobacco: Never Used  Substance and Sexual Activity  . Alcohol use: Yes    Alcohol/week: 0.0 standard drinks    Comment: occ  . Drug use: No  . Sexual activity: Yes  Lifestyle  . Physical activity:    Days per week: Not on file    Minutes per session: Not on file  .  Stress: Not on file  Relationships  . Social connections:    Talks on phone: Not on file    Gets together: Not on file    Attends religious service: Not on file    Active member of club or organization: Not on file    Attends meetings of clubs or organizations: Not on file    Relationship status: Not on file  . Intimate partner violence:    Fear of current or ex partner: Not on file    Emotionally abused: Not on file    Physically abused: Not on file    Forced sexual  activity: Not on file  Other Topics Concern  . Not on file  Social History Narrative  . Not on file    ROS Review of Systems  Constitutional: Negative for activity change, appetite change, chills and fever.  HENT: Negative for congestion, nosebleeds, trouble swallowing and voice change.   Respiratory: Negative for cough, shortness of breath and wheezing.   Gastrointestinal: Negative for diarrhea, nausea and vomiting.  Genitourinary: Negative for difficulty urinating, dysuria, flank pain and hematuria.  Musculoskeletal: Negative for back pain, joint swelling and neck pain.  Neurological: Negative for dizziness, speech difficulty, light-headedness and numbness.  See HPI. All other review of systems negative.   Objective   Vitals as reported by the patient: There were no vitals filed for this visit.  Diagnoses and all orders for this visit:  Essential hypertension- bp in good range on hctz 25mg  and amlodipine 5mg   Tobacco abuse - discussed tobacco use and discussed smoking cessation     I discussed the assessment and treatment plan with the patient. The patient was provided an opportunity to ask questions and all were answered. The patient agreed with the plan and demonstrated an understanding of the instructions.   The patient was advised to call back or seek an in-person evaluation if the symptoms worsen or if the condition fails to improve as anticipated.  I provided 20 minutes of non-face-to-face time during this encounter.  Forrest Moron, MD  Primary Care at [facility]

## 2018-07-21 NOTE — Patient Instructions (Signed)
° ° ° °  If you have lab work done today you will be contacted with your lab results within the next 2 weeks.  If you have not heard from us then please contact us. The fastest way to get your results is to register for My Chart. ° ° °IF you received an x-ray today, you will receive an invoice from [facility]. Please contact [facility] at [phone] with questions or concerns regarding your invoice.  ° °IF you received labwork today, you will receive an invoice from [facility]. Please contact [facility] at [phone] with questions or concerns regarding your invoice.  ° °Our billing staff will not be able to assist you with questions regarding bills from these companies. ° °You will be contacted with the lab results as soon as they are available. The fastest way to get your results is to activate your My Chart account. Instructions are located on the last page of this paperwork. If you have not heard from us regarding the results in 2 weeks, please contact this office. °  ° ° ° °

## 2018-07-21 NOTE — Progress Notes (Signed)
CC: 3 month f/u hypertension.  No refills needed.
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
## 2019-02-18 ENCOUNTER — Other Ambulatory Visit: Payer: Self-pay

## 2019-02-18 ENCOUNTER — Encounter: Payer: Self-pay | Admitting: Family Medicine

## 2019-02-18 ENCOUNTER — Ambulatory Visit (INDEPENDENT_AMBULATORY_CARE_PROVIDER_SITE_OTHER): Payer: 59 | Admitting: Family Medicine

## 2019-02-18 VITALS — BP 136/81 | HR 76 | Temp 98.7°F | Ht 67.44 in | Wt 173.0 lb

## 2019-02-18 DIAGNOSIS — Z23 Encounter for immunization: Secondary | ICD-10-CM

## 2019-02-18 DIAGNOSIS — Z0000 Encounter for general adult medical examination without abnormal findings: Secondary | ICD-10-CM | POA: Diagnosis not present

## 2019-02-18 DIAGNOSIS — Z125 Encounter for screening for malignant neoplasm of prostate: Secondary | ICD-10-CM | POA: Diagnosis not present

## 2019-02-18 LAB — POCT URINALYSIS DIP (MANUAL ENTRY)
Bilirubin, UA: NEGATIVE
Blood, UA: NEGATIVE
Glucose, UA: NEGATIVE mg/dL
Ketones, POC UA: NEGATIVE mg/dL
Leukocytes, UA: NEGATIVE
Nitrite, UA: NEGATIVE
Protein Ur, POC: NEGATIVE mg/dL
Spec Grav, UA: 1.015 (ref 1.010–1.025)
Urobilinogen, UA: 0.2 E.U./dL
pH, UA: 6 (ref 5.0–8.0)

## 2019-02-18 NOTE — Progress Notes (Signed)
Chief Complaint  Patient presents with  . Annual Exam    Subjective:  Russell English is a 53 y.o. male here for a health maintenance visit.  Patient is established pt  Patient Active Problem List   Diagnosis Date Noted  . Acute upper respiratory infection 03/13/2017  . Cough 03/13/2017  . Essential hypertension 03/13/2017    Past Medical History:  Diagnosis Date  . Hypertension     No past surgical history on file.   Outpatient Medications Prior to Visit  Medication Sig Dispense Refill  . amLODipine (NORVASC) 5 MG tablet Take 1 tablet (5 mg total) by mouth daily. 90 tablet 3  . hydrochlorothiazide (HYDRODIURIL) 25 MG tablet Take 0.5 tablets (12.5 mg total) by mouth daily. 30 tablet 5  . ibuprofen (ADVIL,MOTRIN) 800 MG tablet Take 1 tablet (800 mg total) by mouth 3 (three) times daily. 21 tablet 0  . ipratropium (ATROVENT) 0.03 % nasal spray Place 2 sprays into both nostrils 2 (two) times daily. 30 mL 0   No facility-administered medications prior to visit.     No Known Allergies   No family history on file.   Health Habits: Dental Exam: up to date Eye Exam: up to date Exercise:  times/week on average Current exercise activities: walking/running Diet:   Social History   Socioeconomic History  . Marital status: Married    Spouse name: Not on file  . Number of children: 2  . Years of education: Not on file  . Highest education level: Not on file  Occupational History  . Not on file  Social Needs  . Financial resource strain: Not on file  . Food insecurity    Worry: Not on file    Inability: Not on file  . Transportation needs    Medical: Not on file    Non-medical: Not on file  Tobacco Use  . Smoking status: Current Every Day Smoker    Packs/day: 0.25    Types: Cigarettes  . Smokeless tobacco: Never Used  Substance and Sexual Activity  . Alcohol use: Yes    Alcohol/week: 0.0 standard drinks    Comment: occ  . Drug use: No  . Sexual activity: Yes   Lifestyle  . Physical activity    Days per week: Not on file    Minutes per session: Not on file  . Stress: Not on file  Relationships  . Social Herbalist on phone: Not on file    Gets together: Not on file    Attends religious service: Not on file    Active member of club or organization: Not on file    Attends meetings of clubs or organizations: Not on file    Relationship status: Not on file  . Intimate partner violence    Fear of current or ex partner: Not on file    Emotionally abused: Not on file    Physically abused: Not on file    Forced sexual activity: Not on file  Other Topics Concern  . Not on file  Social History Narrative  . Not on file   Social History   Substance and Sexual Activity  Alcohol Use Yes  . Alcohol/week: 0.0 standard drinks   Comment: occ   Social History   Tobacco Use  Smoking Status Current Every Day Smoker  . Packs/day: 0.25  . Types: Cigarettes  Smokeless Tobacco Never Used   Social History   Substance and Sexual Activity  Drug Use No  Health Maintenance: See under health Maintenance activity for review of completion dates as well. There is no immunization history for the selected administration types on file for this patient.    Depression Screen-PHQ2/9 Depression screen [facility] 2/9 02/18/2019 04/02/2018 04/02/2018 03/26/2018 03/13/2017  Decreased Interest 0 0 0 0 0  Down, Depressed, Hopeless 0 0 0 0 0  PHQ - 2 Score 0 0 0 0 0       Depression Severity and Treatment Recommendations:  0-4= None  5-9= Mild / Treatment: Support, educate to call if worse; return in one month  10-14= Moderate / Treatment: Support, watchful waiting; Antidepressant or Psycotherapy  15-19= Moderately severe / Treatment: Antidepressant OR Psychotherapy  >= 20 = Major depression, severe / Antidepressant AND Psychotherapy    Review of Systems   ROS  See HPI for ROS as well.   Review of Systems  Constitutional: Negative for  activity change, appetite change, chills and fever.  HENT: Negative for congestion, nosebleeds, trouble swallowing and voice change.   Respiratory: Negative for cough, shortness of breath and wheezing.   Gastrointestinal: Negative for diarrhea, nausea and vomiting.  Genitourinary: Negative for difficulty urinating, dysuria, flank pain and hematuria.  Musculoskeletal: Negative for back pain, joint swelling and neck pain.  Neurological: Negative for dizziness, speech difficulty, light-headedness and numbness.  See HPI. All other review of systems negative.    Objective:   Vitals:   02/18/19 1614  BP: 136/81  Pulse: 76  Temp: 98.7 F (37.1 C)  SpO2: 100%  Weight: 173 lb (78.5 kg)  Height: 5' 7.44" (1.713 m)    Body mass index is 26.74 kg/m.  Physical Exam Constitutional:      Appearance: Normal appearance. He is normal weight.  HENT:     Head: Normocephalic and atraumatic.     Nose: Nose normal. No congestion.  Cardiovascular:     Rate and Rhythm: Normal rate and regular rhythm.     Pulses: Normal pulses.     Heart sounds: No murmur.  Pulmonary:     Effort: Pulmonary effort is normal. No respiratory distress.     Breath sounds: Normal breath sounds. No stridor. No wheezing, rhonchi or rales.  Chest:     Chest wall: No tenderness.  Abdominal:     General: Bowel sounds are normal. There is no distension.     Palpations: Abdomen is soft. There is no mass.     Tenderness: There is no abdominal tenderness. There is no right CVA tenderness, left CVA tenderness, guarding or rebound.     Hernia: No hernia is present.  Musculoskeletal: Normal range of motion.        General: No swelling, tenderness, deformity or signs of injury.     Right lower leg: No edema.     Left lower leg: No edema.  Skin:    General: Skin is warm.     Capillary Refill: Capillary refill takes less than 2 seconds.  Neurological:     General: No focal deficit present.     Mental Status: He is alert and  oriented to person, place, and time.  Psychiatric:        Mood and Affect: Mood normal.        Behavior: Behavior normal.        Thought Content: Thought content normal.        Judgment: Judgment normal.       Assessment/Plan:   Patient was seen for a health maintenance exam.  Counseled the  patient on health maintenance issues. Reviewed her health mainteance schedule and ordered appropriate tests (see orders.) Counseled on regular exercise and weight management. Recommend regular eye exams and dental cleaning.   The following issues were addressed today for health maintenance:   Ammiel was seen today for annual exam.  Diagnoses and all orders for this visit:  Encounter for health maintenance examination in adult- Women's Health Maintenance Plan Advised monthly breast exam and annual mammogram Advised dental exam every six months Discussed stress management Discussed pap smear screening guidelines  Need for vaccination -     Td vaccine greater than or equal to 7yo preservative free IM  Screening PSA (prostate specific antigen) -     POCT urinalysis dipstick    No follow-ups on file.    Body mass index is 26.74 kg/m.:  Discussed the patient's BMI with patient. The BMI body mass index is 26.74 kg/m.     No future appointments.  Patient Instructions       If you have lab work done today you will be contacted with your lab results within the next 2 weeks.  If you have not heard from [place] then please contact us. The fastest way to get your results is to register for My Chart.   IF you received an x-ray today, you will receive an invoice from [facility]. Please contact [facility] at [phone] with questions or concerns regarding your invoice.   IF you received labwork today, you will receive an invoice from [place]. Please contact [facility] at [phone] with questions or concerns regarding your invoice.   Our billing staff will not be able  to assist you with questions regarding bills from these companies.  You will be contacted with the lab results as soon as they are available. The fastest way to get your results is to activate your My Chart account. Instructions are located on the last page of this paperwork. If you have not heard from [place] regarding the results in 2 weeks, please contact this office.

## 2019-02-18 NOTE — Patient Instructions (Addendum)
Travel recommendations  Mandatory - Yellow FEVER     If you have lab work done today you will be contacted with your lab results within the next 2 weeks.  If you have not heard from [place] then please contact us. The fastest way to get your results is to register for My Chart.   IF you received an x-ray today, you will receive an invoice from [facility]. Please contact [facility] at [phone] with questions or concerns regarding your invoice.   IF you received labwork today, you will receive an invoice from [place]. Please contact [facility] at [phone] with questions or concerns regarding your invoice.   Our billing staff will not be able to assist you with questions regarding bills from these companies.  You will be contacted with the lab results as soon as they are available. The fastest way to get your results is to activate your My Chart account. Instructions are located on the last page of this paperwork. If you have not heard from [place] regarding the results in 2 weeks, please contact this office.     Preventive Care 54-11 Years Old, Male Preventive care refers to lifestyle choices and visits with your health care provider that can promote health and wellness. This includes:  A yearly physical exam. This is also called an annual well check.  Regular dental and eye exams.  Immunizations.  Screening for certain conditions.  Healthy lifestyle choices, such as eating a healthy diet, getting regular exercise, not using drugs or products that contain nicotine and tobacco, and limiting alcohol use. What can I expect for my preventive care visit? Physical exam Your health care provider will check:  Height and weight. These may be used to calculate body mass index (BMI), which is a measurement that tells if you are at a healthy weight.  Heart rate and blood pressure.  Your skin for abnormal spots. Counseling Your health care provider may ask you questions  about:  Alcohol, tobacco, and drug use.  Emotional well-being.  Home and relationship well-being.  Sexual activity.  Eating habits.  Work and work Statistician. What immunizations do I need?  Influenza (flu) vaccine  This is recommended every year. Tetanus, diphtheria, and pertussis (Tdap) vaccine  You may need a Td booster every 10 years. Varicella (chickenpox) vaccine  You may need this vaccine if you have not already been vaccinated. Zoster (shingles) vaccine  You may need this after age 22. Measles, mumps, and rubella (MMR) vaccine  You may need at least one dose of MMR if you were born in 1957 or later. You may also need a second dose. Pneumococcal conjugate (PCV13) vaccine  You may need this if you have certain conditions and were not previously vaccinated. Pneumococcal polysaccharide (PPSV23) vaccine  You may need one or two doses if you smoke cigarettes or if you have certain conditions. Meningococcal conjugate (MenACWY) vaccine  You may need this if you have certain conditions. Hepatitis A vaccine  You may need this if you have certain conditions or if you travel or work in places where you may be exposed to hepatitis A. Hepatitis B vaccine  You may need this if you have certain conditions or if you travel or work in places where you may be exposed to hepatitis B. Haemophilus influenzae type b (Hib) vaccine  You may need this if you have certain risk factors. Human papillomavirus (HPV) vaccine  If recommended by your health care provider, you may need three doses over 6  months. You may receive vaccines as individual doses or as more than one vaccine together in one shot (combination vaccines). Talk with your health care provider about the risks and benefits of combination vaccines. What tests do I need? Blood tests  Lipid and cholesterol levels. These may be checked every 5 years, or more frequently if you are over 54 years old.  Hepatitis C  test.  Hepatitis B test. Screening  Lung cancer screening. You may have this screening every year starting at age 54 if you have a 30-pack-year history of smoking and currently smoke or have quit within the past 15 years.  Prostate cancer screening. Recommendations will vary depending on your family history and other risks.  Colorectal cancer screening. All adults should have this screening starting at age 54 and continuing until age 75. Your health care provider may recommend screening at age 2 if you are at increased risk. You will have tests every 1-10 years, depending on your results and the type of screening test.  Diabetes screening. This is done by checking your blood sugar (glucose) after you have not eaten for a while (fasting). You may have this done every 1-3 years.  Sexually transmitted disease (STD) testing. Follow these instructions at home: Eating and drinking  Eat a diet that includes fresh fruits and vegetables, whole grains, lean protein, and low-fat dairy products.  Take vitamin and mineral supplements as recommended by your health care provider.  Do not drink alcohol if your health care provider tells you not to drink.  If you drink alcohol: ? Limit how much you have to 0-2 drinks a day. ? Be aware of how much alcohol is in your drink. In the [place] one drink equals one 12 oz bottle of beer (355 mL), one 5 oz glass of wine (148 mL), or one 1 oz glass of hard liquor (44 mL). Lifestyle  Take daily care of your teeth and gums.  Stay active. Exercise for at least 30 minutes on 5 or more days each week.  Do not use any products that contain nicotine or tobacco, such as cigarettes, e-cigarettes, and chewing tobacco. If you need help quitting, ask your health care provider.  If you are sexually active, practice safe sex. Use a condom or other form of protection to prevent STIs (sexually transmitted infections).  Talk with your health care provider about taking a  low-dose aspirin every day starting at age 54. What's next?  Go to your health care provider once a year for a well check visit.  Ask your health care provider how often you should have your eyes and teeth checked.  Stay up to date on all vaccines. This information is not intended to replace advice given to you by your health care provider. Make sure you discuss any questions you have with your health care provider. Document Released: 05/06/2015 Document Revised: 04/03/2018 Document Reviewed: 04/03/2018 Elsevier Patient Education  2020 [employer].

## 2019-04-28 ENCOUNTER — Telehealth: Payer: Self-pay | Admitting: Family Medicine

## 2019-04-28 DIAGNOSIS — I10 Essential (primary) hypertension: Secondary | ICD-10-CM

## 2019-04-28 DIAGNOSIS — R6889 Other general symptoms and signs: Secondary | ICD-10-CM

## 2019-04-28 DIAGNOSIS — Z760 Encounter for issue of repeat prescription: Secondary | ICD-10-CM

## 2019-04-28 NOTE — Telephone Encounter (Signed)
Pt was supposed to get refills on all meds/ Pt was seen the end of Oct .. Pt needs  amLODipine (NORVASC) 5 MG tablet  hydrochlorothiazide (HYDRODIURIL) 25 MG tablet  ibuprofen (ADVIL,MOTRIN) 800 MG tablet  ipratropium (ATROVENT) 0.03 % nasal spray    Patient is completely out . All meds should be written for 12 months  Pt wife received hers

## 2019-04-30 MED ORDER — IPRATROPIUM BROMIDE 0.03 % NA SOLN: 2.0000 | Freq: Two times a day (BID) | NASAL | 3 refills | Status: DC

## 2019-04-30 MED ORDER — HYDROCHLOROTHIAZIDE 25 MG PO TABS: 12.5000 mg | ORAL_TABLET | Freq: Every day | ORAL | 3 refills | Status: DC

## 2019-04-30 MED ORDER — AMLODIPINE BESYLATE 5 MG PO TABS: 5.0000 mg | ORAL_TABLET | Freq: Every day | ORAL | 3 refills | Status: DC

## 2019-04-30 NOTE — Telephone Encounter (Signed)
Refill has been sent in. Please notify the patient

## 2019-04-30 NOTE — Telephone Encounter (Signed)
Done

## 2019-04-30 NOTE — Telephone Encounter (Signed)
Patient wife notified that medication were sent into pharmacy

## 2019-05-06 ENCOUNTER — Other Ambulatory Visit: Payer: Self-pay | Admitting: Family Medicine

## 2019-05-06 DIAGNOSIS — Z760 Encounter for issue of repeat prescription: Secondary | ICD-10-CM

## 2019-05-06 MED ORDER — IBUPROFEN 800 MG PO TABS: 800.0000 mg | ORAL_TABLET | Freq: Three times a day (TID) | ORAL | 3 refills | Status: AC | PRN

## 2019-06-02 ENCOUNTER — Telehealth: Payer: Self-pay | Admitting: Family Medicine

## 2019-06-02 NOTE — Telephone Encounter (Signed)
Pt need a refill on amlodipine . Pt wife said pharm did not received the request in jan 2021. [place] [address]

## 2019-06-02 NOTE — Telephone Encounter (Signed)
Spoke with [facility] at [place] and she advised amlodipine last filled Jan. 2020 and they didn't receive refill on amlodipine on 04/30/2019.  Gave verbal to fill amlodipine 5 mg #90 with 3 refills.  Notified pt (wife) medicaton called in to [facility] and the can p/u at their convenience.

## 2019-07-13 ENCOUNTER — Telehealth: Payer: Self-pay | Admitting: Family Medicine

## 2019-07-13 ENCOUNTER — Ambulatory Visit: Payer: 59 | Admitting: Family Medicine

## 2019-07-13 NOTE — Telephone Encounter (Signed)
Patient would like to see if dr, Nolon Rod can see him sooner than her first available in office appointment. He says that dr.stallings knows [place] and [place] soon.   He is scheduled for 07/14/19 with [facility] for high bp . But he wants to get a call back from dr,stallings  [phone]

## 2019-07-14 ENCOUNTER — Encounter: Payer: Self-pay | Admitting: Family Medicine

## 2019-07-14 ENCOUNTER — Encounter: Payer: Self-pay | Admitting: Registered Nurse

## 2019-07-14 ENCOUNTER — Other Ambulatory Visit: Payer: Self-pay

## 2019-07-14 ENCOUNTER — Ambulatory Visit (INDEPENDENT_AMBULATORY_CARE_PROVIDER_SITE_OTHER): Payer: 59 | Admitting: Registered Nurse

## 2019-07-14 VITALS — BP 144/82 | HR 69 | Temp 97.7°F | Resp 16 | Ht 67.4 in | Wt 167.8 lb

## 2019-07-14 DIAGNOSIS — Z125 Encounter for screening for malignant neoplasm of prostate: Secondary | ICD-10-CM

## 2019-07-14 DIAGNOSIS — Z13220 Encounter for screening for lipoid disorders: Secondary | ICD-10-CM | POA: Diagnosis not present

## 2019-07-14 DIAGNOSIS — Z130 Encounter for screening for diseases of the blood and blood-forming organs and certain disorders involving the immune mechanism: Secondary | ICD-10-CM

## 2019-07-14 DIAGNOSIS — I10 Essential (primary) hypertension: Secondary | ICD-10-CM

## 2019-07-14 DIAGNOSIS — Z13228 Encounter for screening for other metabolic disorders: Secondary | ICD-10-CM

## 2019-07-14 DIAGNOSIS — Z1329 Encounter for screening for other suspected endocrine disorder: Secondary | ICD-10-CM

## 2019-07-14 MED ORDER — AMLODIPINE BESYLATE 10 MG PO TABS: 10.0000 mg | ORAL_TABLET | Freq: Every day | ORAL | 3 refills | Status: DC

## 2019-07-14 NOTE — Patient Instructions (Signed)
° ° ° °  If you have lab work done today you will be contacted with your lab results within the next 2 weeks.  If you have not heard from us then please contact us. The fastest way to get your results is to register for My Chart. ° ° °IF you received an x-ray today, you will receive an invoice from [facility]. Please contact [facility] at [phone] with questions or concerns regarding your invoice.  ° °IF you received labwork today, you will receive an invoice from [facility]. Please contact [facility] at [phone] with questions or concerns regarding your invoice.  ° °Our billing staff will not be able to assist you with questions regarding bills from these companies. ° °You will be contacted with the lab results as soon as they are available. The fastest way to get your results is to activate your My Chart account. Instructions are located on the last page of this paperwork. If you have not heard from us regarding the results in 2 weeks, please contact this office. °  ° ° ° °

## 2019-07-14 NOTE — Progress Notes (Signed)
Established Patient Office Visit  Subjective:  Patient ID: Russell English, male    DOB: 11-25-1965  Age: 54 y.o. MRN: [id number]  CC:  Chief Complaint  Patient presents with  . Hypertension    patient states his blood pressure has been too high and trying to figure out if the medication is working because he states all his readings are 150 or greater   . Medication Refill    on all medications     HPI Russell English presents for elevated bp Notes his home cuff has been giving systolic readings in [id number] Denies cv symptoms including shob, doe, chest pain, headache, visual changes, claudication, dependent edema Feeling well overall but notes he loses his benefits on Friday as he is retiring early. He states that he is hoping to get BP adjusted, labs done, and CPE before these expire  No further concerns at this time.  Past Medical History:  Diagnosis Date  . Hypertension     No past surgical history on file.  No family history on file.  Social History   Socioeconomic History  . Marital status: Married    Spouse name: Not on file  . Number of children: 2  . Years of education: Not on file  . Highest education level: Not on file  Occupational History  . Not on file  Tobacco Use  . Smoking status: Current Every Day Smoker    Packs/day: 0.25    Types: Cigarettes  . Smokeless tobacco: Never Used  Substance and Sexual Activity  . Alcohol use: Yes    Alcohol/week: 0.0 standard drinks    Comment: occ  . Drug use: No  . Sexual activity: Yes  Other Topics Concern  . Not on file  Social History Narrative  . Not on file   Social Determinants of Health   Financial Resource Strain:   . Difficulty of Paying Living Expenses:   Food Insecurity:   . Worried About Charity fundraiser in the Last Year:   . Arboriculturist in the Last Year:   Transportation Needs:   . Film/video editor (Medical):   Marland Kitchen Lack of Transportation (Non-Medical):   Physical Activity:   . Days of  Exercise per Week:   . Minutes of Exercise per Session:   Stress:   . Feeling of Stress :   Social Connections:   . Frequency of Communication with Friends and Family:   . Frequency of Social Gatherings with Friends and Family:   . Attends Religious Services:   . Active Member of Clubs or Organizations:   . Attends Archivist Meetings:   Marland Kitchen Marital Status:   Intimate Partner Violence:   . Fear of Current or Ex-Partner:   . Emotionally Abused:   Marland Kitchen Physically Abused:   . Sexually Abused:     Outpatient Medications Prior to Visit  Medication Sig Dispense Refill  . hydrochlorothiazide (HYDRODIURIL) 25 MG tablet Take 0.5 tablets (12.5 mg total) by mouth daily. 90 tablet 3  . ibuprofen (ADVIL) 800 MG tablet Take 1 tablet (800 mg total) by mouth every 8 (eight) hours as needed. 90 tablet 3  . ipratropium (ATROVENT) 0.03 % nasal spray Place 2 sprays into both nostrils 2 (two) times daily. 90 mL 3  . amLODipine (NORVASC) 5 MG tablet Take 1 tablet (5 mg total) by mouth daily. 90 tablet 3   No facility-administered medications prior to visit.    No Known Allergies  ROS Review of  Systems  Constitutional: Negative.   HENT: Negative.   Eyes: Negative.   Respiratory: Negative.   Cardiovascular: Negative.   Gastrointestinal: Negative.   Endocrine: Negative.   Genitourinary: Negative.   Musculoskeletal: Negative.   Skin: Negative.   Allergic/Immunologic: Negative.   Neurological: Negative.   Hematological: Negative.   Psychiatric/Behavioral: Negative.   All other systems reviewed and are negative.     Objective:    Physical Exam  Constitutional: He is oriented to person, place, and time. He appears well-developed and well-nourished. No distress.  Cardiovascular: Normal rate, regular rhythm and normal heart sounds. Exam reveals no gallop and no friction rub.  No murmur heard. Pulmonary/Chest: Effort normal. No respiratory distress.  Neurological: He is alert and  oriented to person, place, and time.  Skin: Skin is warm and dry. No rash noted. He is not diaphoretic. No erythema. No pallor.  Psychiatric: He has a normal mood and affect. His behavior is normal. Judgment and thought content normal.  Nursing note and vitals reviewed.   BP (!) 144/82   Pulse 69   Temp 97.7 F (36.5 C) (Temporal)   Resp 16   Ht 5' 7.4" (1.712 m)   Wt 167 lb 12.8 oz (76.1 kg)   SpO2 99%   BMI 25.97 kg/m  Wt Readings from Last 3 Encounters:  07/14/19 167 lb 12.8 oz (76.1 kg)  02/18/19 173 lb (78.5 kg)  04/02/18 178 lb (80.7 kg)     Health Maintenance Due  Topic Date Due  . COLONOSCOPY  Never done    There are no preventive care reminders to display for this patient.  No results found for: TSH Lab Results  Component Value Date   WBC 15.8 (A) 05/24/2015   HGB 14.4 05/24/2015   HCT 41.1 (A) 05/24/2015   MCV 93.0 05/24/2015   Lab Results  Component Value Date   NA 145 (H) 03/26/2018   K 4.2 03/26/2018   CO2 19 (L) 03/26/2018   GLUCOSE 104 (H) 03/26/2018   BUN 12 03/26/2018   CREATININE 1.03 03/26/2018   BILITOT 0.4 03/26/2018   ALKPHOS 95 03/26/2018   AST 23 03/26/2018   ALT 32 03/26/2018   PROT 7.7 03/26/2018   ALBUMIN 5.3 03/26/2018   CALCIUM 10.1 03/26/2018   No results found for: CHOL No results found for: HDL No results found for: LDLCALC No results found for: TRIG No results found for: CHOLHDL Lab Results  Component Value Date   HGBA1C 5.5 03/26/2018      Assessment & Plan:   Problem List Items Addressed This Visit      Cardiovascular and Mediastinum   Essential hypertension   Relevant Medications   amLODipine (NORVASC) 10 MG tablet    Other Visit Diagnoses    Screening PSA (prostate specific antigen)    -  Primary   Relevant Orders   PSA   Screening for endocrine, metabolic and immunity disorder       Relevant Orders   CBC With Differential   Comprehensive metabolic panel   TSH   Hemoglobin A1c   Lipid screening        Relevant Orders   Lipid panel      Meds ordered this encounter  Medications  . amLODipine (NORVASC) 10 MG tablet    Sig: Take 1 tablet (10 mg total) by mouth daily.    Dispense:  90 tablet    Refill:  3    Order Specific Question:   Supervising Provider  AnswerForrest Moron [id number]    Follow-up: Return in about 3 days (around 07/17/2019) for CPE on Thurs Felton Clinton) or Orland Mustard (Fri).   PLAN  Return for cpe. Bring home cuff to check against our cuffs  Double amlodipine to 10mg  Po qd  Monitor for cv symptoms  Labs collected, will follow up with any concerns  Patient encouraged to call clinic with any questions, comments, or concerns.  Maximiano Coss, NP

## 2019-07-15 LAB — COMPREHENSIVE METABOLIC PANEL
ALT: 18 IU/L (ref 0–44)
AST: 26 IU/L (ref 0–40)
Albumin/Globulin Ratio: 1.8 (ref 1.2–2.2)
Albumin: 4.8 g/dL (ref 3.8–4.9)
Alkaline Phosphatase: 93 IU/L (ref 39–117)
BUN/Creatinine Ratio: 12 (ref 9–20)
BUN: 12 mg/dL (ref 6–24)
Bilirubin Total: 0.4 mg/dL (ref 0.0–1.2)
CO2: 20 mmol/L (ref 20–29)
Calcium: 9.6 mg/dL (ref 8.7–10.2)
Chloride: 104 mmol/L (ref 96–106)
Creatinine, Ser: 0.98 mg/dL (ref 0.76–1.27)
GFR calc Af Amer: 101 mL/min/{1.73_m2} (ref >59)
GFR calc non Af Amer: 87 mL/min/{1.73_m2} (ref >59)
Globulin, Total: 2.7 g/dL (ref 1.5–4.5)
Glucose: 73 mg/dL (ref 65–99)
Potassium: 3.8 mmol/L (ref 3.5–5.2)
Sodium: 141 mmol/L (ref 134–144)
Total Protein: 7.5 g/dL (ref 6.0–8.5)

## 2019-07-15 LAB — PSA: Prostate Specific Ag, Serum: 0.5 ng/mL (ref 0.0–4.0)

## 2019-07-15 LAB — HEMOGLOBIN A1C
Est. average glucose Bld gHb Est-mCnc: 108 mg/dL
Hgb A1c MFr Bld: 5.4 % (ref 4.8–5.6)

## 2019-07-15 LAB — LIPID PANEL
Chol/HDL Ratio: 3.7 ratio (ref 0.0–5.0)
Cholesterol, Total: 204 mg/dL — ABNORMAL HIGH (ref 100–199)
HDL: 55 mg/dL (ref >39)
LDL Chol Calc (NIH): 122 mg/dL — ABNORMAL HIGH (ref 0–99)
Triglycerides: 151 mg/dL — ABNORMAL HIGH (ref 0–149)
VLDL Cholesterol Cal: 27 mg/dL (ref 5–40)

## 2019-07-15 LAB — CBC WITH DIFFERENTIAL
Basophils Absolute: 0 10*3/uL (ref 0.0–0.2)
Basos: 1 %
EOS (ABSOLUTE): 0.1 10*3/uL (ref 0.0–0.4)
Eos: 2 %
Hematocrit: 44.2 % (ref 37.5–51.0)
Hemoglobin: 15.8 g/dL (ref 13.0–17.7)
Immature Grans (Abs): 0 10*3/uL (ref 0.0–0.1)
Immature Granulocytes: 0 %
Lymphocytes Absolute: 2.9 10*3/uL (ref 0.7–3.1)
Lymphs: 38 %
MCH: 32.6 pg (ref 26.6–33.0)
MCHC: 35.7 g/dL (ref 31.5–35.7)
MCV: 91 fL (ref 79–97)
Monocytes Absolute: 0.5 10*3/uL (ref 0.1–0.9)
Monocytes: 7 %
Neutrophils Absolute: 4.1 10*3/uL (ref 1.4–7.0)
Neutrophils: 52 %
RBC: 4.85 x10E6/uL (ref 4.14–5.80)
RDW: 12.8 % (ref 11.6–15.4)
WBC: 7.7 10*3/uL (ref 3.4–10.8)

## 2019-07-15 LAB — TSH: TSH: 1.88 u[IU]/mL (ref 0.450–4.500)

## 2019-07-17 ENCOUNTER — Ambulatory Visit (INDEPENDENT_AMBULATORY_CARE_PROVIDER_SITE_OTHER): Payer: 59 | Admitting: Registered Nurse

## 2019-07-17 ENCOUNTER — Other Ambulatory Visit: Payer: Self-pay

## 2019-07-17 VITALS — BP 142/87 | HR 100 | Temp 97.6°F | Ht 66.0 in | Wt 168.0 lb

## 2019-07-17 DIAGNOSIS — I10 Essential (primary) hypertension: Secondary | ICD-10-CM

## 2019-07-17 DIAGNOSIS — Z0001 Encounter for general adult medical examination with abnormal findings: Secondary | ICD-10-CM | POA: Diagnosis not present

## 2019-07-17 DIAGNOSIS — Z0000 Encounter for general adult medical examination without abnormal findings: Secondary | ICD-10-CM

## 2019-07-17 DIAGNOSIS — E785 Hyperlipidemia, unspecified: Secondary | ICD-10-CM

## 2019-07-17 DIAGNOSIS — Z1211 Encounter for screening for malignant neoplasm of colon: Secondary | ICD-10-CM | POA: Diagnosis not present

## 2019-07-17 DIAGNOSIS — Z7689 Persons encountering health services in other specified circumstances: Secondary | ICD-10-CM

## 2019-07-17 MED ORDER — LOSARTAN POTASSIUM-HCTZ 100-25 MG PO TABS: 1.0000 | ORAL_TABLET | Freq: Every day | ORAL | 3 refills | Status: DC

## 2019-07-17 MED ORDER — ATORVASTATIN CALCIUM 20 MG PO TABS: 20.0000 mg | ORAL_TABLET | Freq: Every day | ORAL | 3 refills | Status: AC

## 2019-07-17 NOTE — Patient Instructions (Signed)
° ° ° °  If you have lab work done today you will be contacted with your lab results within the next 2 weeks.  If you have not heard from us then please contact us. The fastest way to get your results is to register for My Chart. ° ° °IF you received an x-ray today, you will receive an invoice from [facility]. Please contact [facility] at [phone] with questions or concerns regarding your invoice.  ° °IF you received labwork today, you will receive an invoice from [facility]. Please contact [facility] at [phone] with questions or concerns regarding your invoice.  ° °Our billing staff will not be able to assist you with questions regarding bills from these companies. ° °You will be contacted with the lab results as soon as they are available. The fastest way to get your results is to activate your My Chart account. Instructions are located on the last page of this paperwork. If you have not heard from us regarding the results in 2 weeks, please contact this office. °  ° ° ° °

## 2019-07-23 ENCOUNTER — Telehealth: Payer: Self-pay | Admitting: Family Medicine

## 2019-07-23 NOTE — Telephone Encounter (Signed)
Noted.  Russell English will sign note.

## 2019-07-23 NOTE — Telephone Encounter (Signed)
Please have Dr. Orland Mustard sign the office visit so the [facility] referral can be sent.

## 2019-07-30 NOTE — Telephone Encounter (Signed)
Please sighn this pt office visit so the referral can be sent out, thank you.

## 2019-07-30 NOTE — Telephone Encounter (Signed)
Please have Morrow sign the office visit so that the [facility] referral can be sent.

## 2019-08-02 ENCOUNTER — Encounter: Payer: Self-pay | Admitting: Registered Nurse

## 2019-08-02 NOTE — Progress Notes (Signed)
Established Patient Office Visit  Subjective:  Patient ID: Russell English, male    DOB: [birth year]  Age: 54 y.o. MRN: [id number]  CC:  Chief Complaint  Patient presents with  . Follow-up    BP    HPI Russell English presents for bp follow up and exam  He is retiring from his work today, wants a quick exam prior to losing his benefits  Feels well overall and is without complaint  Due for colonoscopy - wants to take referral.   No further concerns.  Past Medical History:  Diagnosis Date  . Hypertension     No past surgical history on file.  No family history on file.  Social History   Socioeconomic History  . Marital status: Married    Spouse name: Not on file  . Number of children: 2  . Years of education: Not on file  . Highest education level: Not on file  Occupational History  . Not on file  Tobacco Use  . Smoking status: Current Every Day Smoker    Packs/day: 0.25    Types: Cigarettes  . Smokeless tobacco: Never Used  Substance and Sexual Activity  . Alcohol use: Yes    Alcohol/week: 0.0 standard drinks    Comment: occ  . Drug use: No  . Sexual activity: Yes  Other Topics Concern  . Not on file  Social History Narrative  . Not on file   Social Determinants of Health   Financial Resource Strain:   . Difficulty of Paying Living Expenses:   Food Insecurity:   . Worried About Charity fundraiser in the Last Year:   . Arboriculturist in the Last Year:   Transportation Needs:   . Film/video editor (Medical):   Marland Kitchen Lack of Transportation (Non-Medical):   Physical Activity:   . Days of Exercise per Week:   . Minutes of Exercise per Session:   Stress:   . Feeling of Stress :   Social Connections:   . Frequency of Communication with Friends and Family:   . Frequency of Social Gatherings with Friends and Family:   . Attends Religious Services:   . Active Member of Clubs or Organizations:   . Attends Archivist Meetings:   Marland Kitchen Marital  Status:   Intimate Partner Violence:   . Fear of Current or Ex-Partner:   . Emotionally Abused:   Marland Kitchen Physically Abused:   . Sexually Abused:     Outpatient Medications Prior to Visit  Medication Sig Dispense Refill  . amLODipine (NORVASC) 10 MG tablet Take 1 tablet (10 mg total) by mouth daily. 90 tablet 3  . hydrochlorothiazide (HYDRODIURIL) 25 MG tablet Take 0.5 tablets (12.5 mg total) by mouth daily. 90 tablet 3  . ibuprofen (ADVIL) 800 MG tablet Take 1 tablet (800 mg total) by mouth every 8 (eight) hours as needed. 90 tablet 3  . ipratropium (ATROVENT) 0.03 % nasal spray Place 2 sprays into both nostrils 2 (two) times daily. 90 mL 3   No facility-administered medications prior to visit.    No Known Allergies  ROS Review of Systems  Constitutional: Negative.   HENT: Negative.   Eyes: Negative.   Respiratory: Negative.   Cardiovascular: Negative.   Gastrointestinal: Negative.   Endocrine: Negative.   Genitourinary: Negative.   Musculoskeletal: Negative.   Skin: Negative.   Allergic/Immunologic: Negative.   Neurological: Negative.   Hematological: Negative.   Psychiatric/Behavioral: Negative.   All other systems reviewed and  are negative.     Objective:    Physical Exam  Constitutional: He is oriented to person, place, and time. He appears well-developed and well-nourished. No distress.  HENT:  Head: Normocephalic and atraumatic.  Right Ear: External ear normal.  Left Ear: External ear normal.  Nose: Nose normal.  Mouth/Throat: Oropharynx is clear and moist. No oropharyngeal exudate.  Eyes: Pupils are equal, round, and reactive to light. Conjunctivae and EOM are normal. Right eye exhibits no discharge. Left eye exhibits no discharge. No scleral icterus.  Neck: No tracheal deviation present. No thyromegaly present.  Cardiovascular: Normal rate, regular rhythm, normal heart sounds and intact distal pulses. Exam reveals no gallop and no friction rub.  No murmur  heard. Pulmonary/Chest: Effort normal and breath sounds normal. No respiratory distress. He has no wheezes. He has no rales. He exhibits no tenderness.  Abdominal: Soft. Bowel sounds are normal. He exhibits no distension and no mass. There is no abdominal tenderness. There is no rebound and no guarding.  Musculoskeletal:        General: No deformity or edema. Normal range of motion.     Cervical back: Normal range of motion and neck supple.  Lymphadenopathy:    He has no cervical adenopathy.  Neurological: He is alert and oriented to person, place, and time. No cranial nerve deficit. He exhibits normal muscle tone. Coordination normal.  Skin: Skin is warm and dry. No rash noted. No erythema. No pallor.  Psychiatric: He has a normal mood and affect. His behavior is normal. Judgment and thought content normal.  Nursing note and vitals reviewed.   BP (!) 142/87 (BP Location: Right Arm, Patient Position: Sitting) Comment: This is with the pt's BP cuff from home  Pulse 100   Temp 97.6 F (36.4 C) (Temporal)   Ht 5\' 6"  (1.676 m)   Wt 168 lb (76.2 kg)   SpO2 98%   BMI 27.12 kg/m  Wt Readings from Last 3 Encounters:  07/17/19 168 lb (76.2 kg)  07/14/19 167 lb 12.8 oz (76.1 kg)  02/18/19 173 lb (78.5 kg)     Health Maintenance Due  Topic Date Due  . COLONOSCOPY  Never done    There are no preventive care reminders to display for this patient.  Lab Results  Component Value Date   TSH 1.880 07/14/2019   Lab Results  Component Value Date   WBC 7.7 07/14/2019   HGB 15.8 07/14/2019   HCT 44.2 07/14/2019   MCV 91 07/14/2019   Lab Results  Component Value Date   NA 141 07/14/2019   K 3.8 07/14/2019   CO2 20 07/14/2019   GLUCOSE 73 07/14/2019   BUN 12 07/14/2019   CREATININE 0.98 07/14/2019   BILITOT 0.4 07/14/2019   ALKPHOS 93 07/14/2019   AST 26 07/14/2019   ALT 18 07/14/2019   PROT 7.5 07/14/2019   ALBUMIN 4.8 07/14/2019   CALCIUM 9.6 07/14/2019   Lab Results   Component Value Date   CHOL 204 (H) 07/14/2019   Lab Results  Component Value Date   HDL 55 07/14/2019   Lab Results  Component Value Date   LDLCALC 122 (H) 07/14/2019   Lab Results  Component Value Date   TRIG 151 (H) 07/14/2019   Lab Results  Component Value Date   CHOLHDL 3.7 07/14/2019   Lab Results  Component Value Date   HGBA1C 5.4 07/14/2019      Assessment & Plan:   Problem List Items Addressed This Visit  Cardiovascular and Mediastinum   Essential hypertension   Relevant Medications   atorvastatin (LIPITOR) 20 MG tablet   losartan-hydrochlorothiazide (HYZAAR) 100-25 MG tablet    Other Visit Diagnoses    Screening for colon cancer    -  Primary   Relevant Orders   Ambulatory referral to Gastroenterology   Elevated lipids       Relevant Medications   atorvastatin (LIPITOR) 20 MG tablet      Meds ordered this encounter  Medications  . atorvastatin (LIPITOR) 20 MG tablet    Sig: Take 1 tablet (20 mg total) by mouth daily.    Dispense:  90 tablet    Refill:  3    Order Specific Question:   Supervising Provider    Answer:   Delia Chimes A [id number]  . losartan-hydrochlorothiazide (HYZAAR) 100-25 MG tablet    Sig: Take 1 tablet by mouth daily.    Dispense:  90 tablet    Refill:  3    Order Specific Question:   Supervising Provider    Answer:   Forrest Moron [id number]    Follow-up: No follow-ups on file.   PLAN  Unremarkable exam  Referral to GI for colonoscopy  Refill Hyzaar and lipitor x 1 year  Reviewed labs  Patient encouraged to call clinic with any questions, comments, or concerns.   Maximiano Coss, NP

## 2019-08-25 ENCOUNTER — Encounter: Payer: Self-pay | Admitting: Registered Nurse

## 2019-09-16 ENCOUNTER — Emergency Department (HOSPITAL_COMMUNITY)
Admission: EM | Admit: 2019-09-16 | Discharge: 2019-09-17 | Disposition: A | Discharge status: Home / Self Care | Payer: No Typology Code available for payment source | Attending: Emergency Medicine | Admitting: Emergency Medicine

## 2019-09-16 ENCOUNTER — Encounter (HOSPITAL_COMMUNITY): Payer: Self-pay

## 2019-09-16 ENCOUNTER — Other Ambulatory Visit: Payer: Self-pay

## 2019-09-16 DIAGNOSIS — S060X0A Concussion without loss of consciousness, initial encounter: Secondary | ICD-10-CM | POA: Insufficient documentation

## 2019-09-16 DIAGNOSIS — T07XXXA Unspecified multiple injuries, initial encounter: Secondary | ICD-10-CM | POA: Diagnosis present

## 2019-09-16 DIAGNOSIS — Y999 Unspecified external cause status: Secondary | ICD-10-CM | POA: Insufficient documentation

## 2019-09-16 DIAGNOSIS — S298XXA Other specified injuries of thorax, initial encounter: Secondary | ICD-10-CM | POA: Diagnosis not present

## 2019-09-16 DIAGNOSIS — Y939 Activity, unspecified: Secondary | ICD-10-CM | POA: Insufficient documentation

## 2019-09-16 DIAGNOSIS — R42 Dizziness and giddiness: Secondary | ICD-10-CM | POA: Insufficient documentation

## 2019-09-16 DIAGNOSIS — Y929 Unspecified place or not applicable: Secondary | ICD-10-CM | POA: Insufficient documentation

## 2019-09-16 LAB — TROPONIN I (HIGH SENSITIVITY)
Troponin I (High Sensitivity): 5 ng/L (ref <18)
Troponin I (High Sensitivity): 5 ng/L (ref <18)
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
## 2019-09-17 ENCOUNTER — Emergency Department (HOSPITAL_COMMUNITY): Payer: No Typology Code available for payment source

## 2019-09-17 MED ORDER — IBUPROFEN 400 MG PO TABS: 400.0000 mg | ORAL_TABLET | Freq: Once | ORAL | Status: DC

## 2019-09-17 MED ORDER — IBUPROFEN 400 MG PO TABS
400.0000 mg | ORAL_TABLET | Freq: Once | ORAL | Status: AC
  Administered 2019-09-17: 400 mg via ORAL
  Filled 2019-09-17: qty 1
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
## 2020-08-07 ENCOUNTER — Other Ambulatory Visit: Payer: Self-pay | Admitting: Registered Nurse

## 2020-08-07 DIAGNOSIS — I10 Essential (primary) hypertension: Secondary | ICD-10-CM

## 2020-08-08 NOTE — Telephone Encounter (Signed)
Needs OV.  

## 2020-09-16 ENCOUNTER — Other Ambulatory Visit: Payer: Self-pay | Admitting: Registered Nurse

## 2022-04-29 ENCOUNTER — Ambulatory Visit
Admission: EM | Admit: 2022-04-29 | Discharge: 2022-04-29 | Disposition: A | Discharge status: Home / Self Care | Payer: Self-pay | Attending: Urgent Care | Admitting: Urgent Care

## 2022-04-29 DIAGNOSIS — I160 Hypertensive urgency: Secondary | ICD-10-CM

## 2022-04-29 DIAGNOSIS — Z760 Encounter for issue of repeat prescription: Secondary | ICD-10-CM

## 2022-04-29 DIAGNOSIS — I10 Essential (primary) hypertension: Secondary | ICD-10-CM

## 2022-04-29 MED ORDER — AMLODIPINE BESYLATE 10 MG PO TABS: 10.0000 mg | ORAL_TABLET | Freq: Every day | ORAL | 0 refills | Status: DC

## 2022-04-29 MED ORDER — HYDROCHLOROTHIAZIDE 25 MG PO TABS: 25.0000 mg | ORAL_TABLET | Freq: Every day | ORAL | 0 refills | Status: DC

## 2022-04-29 NOTE — ED Provider Notes (Signed)
[facility]  Note:  This document was prepared using Systems analyst and may include unintentional dictation errors.  MRN: [id number] DOB: 01/20/1966  Subjective:   Russell English is a 57 y.o. male presenting for medication refill of his blood pressure meds. Has been out for 3 weeks. Takes amlodipine, hydrochlorothiazide.  Denies headache, confusion, vision change, chest pain, left arm pain, neck pain, jaw pain, numbness or tingling.  No history of MI or heart disease.  Needs to establish with a new PCP as his previous one is no longer available to him.  Does not practice hypertensive friendly diet.  No current facility-administered medications for this encounter.  Current Outpatient Medications:    amLODipine (NORVASC) 10 MG tablet, TAKE 1 TABLET(10 MG) BY MOUTH DAILY, Disp: 30 tablet, Rfl: 3   atorvastatin (LIPITOR) 20 MG tablet, Take 1 tablet (20 mg total) by mouth daily., Disp: 90 tablet, Rfl: 3   hydrochlorothiazide (HYDRODIURIL) 25 MG tablet, Take 0.5 tablets (12.5 mg total) by mouth daily., Disp: 90 tablet, Rfl: 3   ibuprofen (ADVIL) 800 MG tablet, Take 1 tablet (800 mg total) by mouth every 8 (eight) hours as needed., Disp: 90 tablet, Rfl: 3   ipratropium (ATROVENT) 0.03 % nasal spray, Place 2 sprays into both nostrils 2 (two) times daily., Disp: 90 mL, Rfl: 3   losartan-hydrochlorothiazide (HYZAAR) 100-25 MG tablet, Take 1 tablet by mouth daily., Disp: 90 tablet, Rfl: 3   No Known Allergies  Past Medical History:  Diagnosis Date   Hypertension      No past surgical history on file.  No family history on file.  Social History   Tobacco Use   Smoking status: Every Day    Packs/day: 0.25    Types: Cigarettes   Smokeless tobacco: Never  Vaping Use   Vaping Use: Never used  Substance Use Topics   Alcohol use: Yes    Alcohol/week: 0.0 standard drinks of alcohol    Comment: occ   Drug use: No    ROS   Objective:    Vitals: BP (!) 222/101 (BP Location: Left Arm)   Pulse (!) 50   Temp 98.2 F (36.8 C) (Oral)   Resp 16   SpO2 99%   BP Readings from Last 3 Encounters:  04/29/22 (!) 222/101  09/17/19 134/71  07/17/19 (!) 142/87   Physical Exam Constitutional:      General: He is not in acute distress.    Appearance: Normal appearance. He is well-developed. He is not ill-appearing, toxic-appearing or diaphoretic.  HENT:     Head: Normocephalic and atraumatic.     Right Ear: External ear normal.     Left Ear: External ear normal.     Nose: Nose normal.     Mouth/Throat:     Mouth: Mucous membranes are moist.  Eyes:     General: No scleral icterus.       Right eye: No discharge.        Left eye: No discharge.     Extraocular Movements: Extraocular movements intact.  Cardiovascular:     Rate and Rhythm: Normal rate and regular rhythm.     Heart sounds: Normal heart sounds. No murmur heard.    No friction rub. No gallop.  Pulmonary:     Effort: Pulmonary effort is normal. No respiratory distress.     Breath sounds: Normal breath sounds. No stridor. No wheezing, rhonchi or rales.  Neurological:     Mental Status: He  is alert and oriented to person, place, and time.     Cranial Nerves: No cranial nerve deficit.     Motor: No weakness.     Coordination: Coordination normal.     Gait: Gait normal.     Comments: No facial symmetry, negative Romberg and pronator drift.  Psychiatric:        Mood and Affect: Mood normal.        Behavior: Behavior normal.        Thought Content: Thought content normal.        Judgment: Judgment normal.     Assessment and Plan :   PDMP not reviewed this encounter.  1. Essential hypertension   2. Hypertensive urgency   3. Encounter for medication refill     No signs of stroke, acute encephalopathy.  Patient is asymptomatic.  Maintain strict ER precautions.  Medication refill provided. Follow up with PCP as soon as possible. Counseled patient on  potential for adverse effects with medications prescribed/recommended today, ER and return-to-clinic precautions discussed, patient verbalized understanding.    Jaynee Eagles, PA-C 04/29/22 1049

## 2022-04-29 NOTE — ED Triage Notes (Signed)
Pt requesting BP meds-out x 3 weeks-c/o HA-NAD-steady gait

## 2022-04-29 NOTE — Discharge Instructions (Addendum)
Start taking amlodipine once daily today. In 3-4 days you can start taking hydrochlorothiazide again if your blood pressure checks are above 140/90. If you develop a headache, confusion, chest pain, numbness or tingling, call 911 or go to the hospital by having a calm family member take you there.    For diabetes or elevated blood sugar, please make sure you are limiting and avoiding starchy, carbohydrate foods like pasta, breads, sweet breads, pastry, rice, potatoes, desserts. These foods can elevate your blood sugar. Also, limit and avoid drinks that contain a lot of sugar such as sodas, sweet teas, fruit juices.  Drinking plain water will be much more helpful, try 64 ounces of water daily.  It is okay to flavor your water naturally by cutting cucumber, lemon, mint or lime, placing it in a picture with water and drinking it over a period of 24-48 hours as long as it remains refrigerated.  For elevated blood pressure, make sure you are monitoring salt in your diet.  Do not eat restaurant foods and limit processed foods at home. I highly recommend you prepare and cook your own foods at home.  Processed foods include things like frozen meals, pre-seasoned meats and dinners, deli meats, canned foods as these foods contain a high amount of sodium/salt.  Make sure you are paying attention to sodium labels on foods you buy at the grocery store. Buy your spices separately such as garlic powder, onion powder, cumin, cayenne, parsley flakes so that you can avoid seasonings that contain salt. However, salt-free seasonings are available and can be used, an example is Mrs. Dash and includes a lot of different mixtures that do not contain salt.  Lastly, when cooking using oils that are healthier for you is important. This includes olive oil, avocado oil, canola oil. We have discussed a lot of foods to avoid but below is a list of foods that can be very healthy to use in your diet whether it is for diabetes, cholesterol,  high blood pressure, or in general healthy eating.  Salads - kale, spinach, cabbage, spring mix, arugula Fruits - avocadoes, berries (blueberries, raspberries, blackberries), apples, oranges, pomegranate, grapefruit, kiwi Vegetables - asparagus, cauliflower, broccoli, green beans, brussel sprouts, bell peppers, beets; stay away from or limit starchy vegetables like potatoes, carrots, peas Other general foods - kidney beans, egg whites, almonds, walnuts, sunflower seeds, pumpkin seeds, fat free yogurt, almond milk, flax seeds, quinoa, oats  Meat - It is better to eat lean meats and limit your red meat including pork to once a week.  Wild caught fish, chicken breast are good options as they tend to be leaner sources of good protein. Still be mindful of the sodium labels for the meats you buy.  DO NOT EAT ANY FOODS ON THIS LIST THAT YOU ARE ALLERGIC TO. For more specific needs, I highly recommend consulting a dietician or nutritionist but this can definitely be a good starting point.

## 2022-07-25 ENCOUNTER — Ambulatory Visit
Admission: EM | Admit: 2022-07-25 | Discharge: 2022-07-25 | Disposition: A | Discharge status: Home / Self Care | Payer: Self-pay | Attending: Urgent Care | Admitting: Urgent Care

## 2022-07-25 DIAGNOSIS — Z760 Encounter for issue of repeat prescription: Secondary | ICD-10-CM

## 2022-07-25 DIAGNOSIS — I10 Essential (primary) hypertension: Secondary | ICD-10-CM

## 2022-07-25 DIAGNOSIS — F17200 Nicotine dependence, unspecified, uncomplicated: Secondary | ICD-10-CM

## 2022-07-25 MED ORDER — VALSARTAN-HYDROCHLOROTHIAZIDE 160-25 MG PO TABS: 1.0000 | ORAL_TABLET | Freq: Every day | ORAL | 0 refills | Status: DC

## 2022-07-25 MED ORDER — AMLODIPINE BESYLATE 10 MG PO TABS: 10.0000 mg | ORAL_TABLET | Freq: Every day | ORAL | 0 refills | Status: DC

## 2022-07-25 NOTE — ED Provider Notes (Addendum)
[facility]  Note:  This document was prepared using Systems analyst and may include unintentional dictation errors.  MRN: [id number] DOB: August 10, 1965  Subjective:   Russell English is a 57 y.o. male presenting for blood pressure check.  Patient reports compliance with his medications.  Needs refills.  They have continue to check it and has been in the [id number] over 0000000 systolic.  Has not had follow-up with a new PCP.  Patient is still smoking.  No chest pain, headache, confusion, weakness, numbness or tingling, left arm/neck/jaw pain, abdominal pain, nausea, vomiting, hematuria.  No current facility-administered medications for this encounter.  Current Outpatient Medications:    amLODipine (NORVASC) 10 MG tablet, Take 1 tablet (10 mg total) by mouth daily., Disp: 90 tablet, Rfl: 0   atorvastatin (LIPITOR) 20 MG tablet, Take 1 tablet (20 mg total) by mouth daily., Disp: 90 tablet, Rfl: 3   hydrochlorothiazide (HYDRODIURIL) 25 MG tablet, Take 1 tablet (25 mg total) by mouth daily., Disp: 90 tablet, Rfl: 0   ibuprofen (ADVIL) 800 MG tablet, Take 1 tablet (800 mg total) by mouth every 8 (eight) hours as needed., Disp: 90 tablet, Rfl: 3   ipratropium (ATROVENT) 0.03 % nasal spray, Place 2 sprays into both nostrils 2 (two) times daily., Disp: 90 mL, Rfl: 3   No Known Allergies  Past Medical History:  Diagnosis Date   Hypertension      History reviewed. No pertinent surgical history.  History reviewed. No pertinent family history.  Social History   Tobacco Use   Smoking status: Every Day    Packs/day: .25    Types: Cigarettes   Smokeless tobacco: Never  Vaping Use   Vaping Use: Never used  Substance Use Topics   Alcohol use: Yes    Alcohol/week: 0.0 standard drinks of alcohol    Comment: occ   Drug use: No    ROS   Objective:   Vitals: BP (!) 149/80 (BP Location: Left Arm)   Pulse (!) 58   Temp 98.6 F (37 C) (Oral)   Resp 18    SpO2 98%   BP Readings from Last 3 Encounters:  07/25/22 (!) 149/80  04/29/22 (!) 222/101  09/17/19 134/71   Blood pressure recheck 156/75.  Physical Exam Constitutional:      General: He is not in acute distress.    Appearance: Normal appearance. He is well-developed. He is not ill-appearing, toxic-appearing or diaphoretic.  HENT:     Head: Normocephalic and atraumatic.     Right Ear: External ear normal.     Left Ear: External ear normal.     Nose: Nose normal.     Mouth/Throat:     Mouth: Mucous membranes are moist.  Eyes:     General: No scleral icterus.       Right eye: No discharge.        Left eye: No discharge.     Extraocular Movements: Extraocular movements intact.  Cardiovascular:     Rate and Rhythm: Normal rate and regular rhythm.     Heart sounds: Normal heart sounds. No murmur heard.    No friction rub. No gallop.  Pulmonary:     Effort: Pulmonary effort is normal. No respiratory distress.     Breath sounds: Normal breath sounds. No stridor. No wheezing, rhonchi or rales.  Skin:    General: Skin is warm and dry.  Neurological:     Mental Status: He is alert and oriented to  person, place, and time.     Cranial Nerves: No cranial nerve deficit.     Motor: No weakness.     Coordination: Coordination normal.     Gait: Gait normal.  Psychiatric:        Mood and Affect: Mood normal.        Behavior: Behavior normal.        Thought Content: Thought content normal.        Judgment: Judgment normal.      Assessment and Plan :   PDMP not reviewed this encounter.  1. Essential hypertension   2. Smoker   3. Encounter for medication refill     Patient refills provided for amlodipine and I am adding valsartan to his hydrochlorothiazide as his blood pressure is not controlled.  No signs of endorgan damage, hypertensive urgency.  Encouraged patient to quit smoking.  Continue efforts at establishing care with new PCP, provided him with information for this.  Counseled patient on potential for adverse effects with medications prescribed/recommended today, ER and return-to-clinic precautions discussed, patient verbalized understanding.   Jaynee Eagles, PA-C 07/25/22 1038

## 2022-07-25 NOTE — ED Triage Notes (Signed)
Pt reports blood pressure is high 150/90 x 1 month. Denies chest pain, dizziness, headache, shortness of breath, vision changes.

## 2022-07-25 NOTE — Discharge Instructions (Addendum)
For diabetes or elevated blood sugar, please make sure you are limiting and avoiding starchy, carbohydrate foods like pasta, breads, sweet breads, pastry, rice, potatoes, desserts. These foods can elevate your blood sugar. Also, limit and avoid drinks that contain a lot of sugar such as sodas, sweet teas, fruit juices.  Drinking plain water will be much more helpful, try 64 ounces of water daily.  It is okay to flavor your water naturally by cutting cucumber, lemon, mint or lime, placing it in a picture with water and drinking it over a period of 24-48 hours as long as it remains refrigerated.  For elevated blood pressure, make sure you are monitoring salt in your diet.  Do not eat restaurant foods and limit processed foods at home. I highly recommend you prepare and cook your own foods at home.  Processed foods include things like frozen meals, pre-seasoned meats and dinners, deli meats, canned foods as these foods contain a high amount of sodium/salt.  Make sure you are paying attention to sodium labels on foods you buy at the grocery store. Buy your spices separately such as garlic powder, onion powder, cumin, cayenne, parsley flakes so that you can avoid seasonings that contain salt. However, salt-free seasonings are available and can be used, an example is Mrs. Dash and includes a lot of different mixtures that do not contain salt.  Lastly, when cooking using oils that are healthier for you is important. This includes olive oil, avocado oil, canola oil. We have discussed a lot of foods to avoid but below is a list of foods that can be very healthy to use in your diet whether it is for diabetes, cholesterol, high blood pressure, or in general healthy eating.  Salads - kale, spinach, cabbage, spring mix, arugula Fruits - avocadoes, berries (blueberries, raspberries, blackberries), apples, oranges, pomegranate, grapefruit, kiwi Vegetables - asparagus, cauliflower, broccoli, green beans, brussel sprouts,  bell peppers, beets; stay away from or limit starchy vegetables like potatoes, carrots, peas Other general foods - kidney beans, egg whites, almonds, walnuts, sunflower seeds, pumpkin seeds, fat free yogurt, almond milk, flax seeds, quinoa, oats  Meat - It is better to eat lean meats and limit your red meat including pork to once a week.  Wild caught fish, chicken breast are good options as they tend to be leaner sources of good protein. Still be mindful of the sodium labels for the meats you buy.  DO NOT EAT ANY FOODS ON THIS LIST THAT YOU ARE ALLERGIC TO. For more specific needs, I highly recommend consulting a dietician or nutritionist but this can definitely be a good starting point.  

## 2023-01-27 ENCOUNTER — Ambulatory Visit
Admission: EM | Admit: 2023-01-27 | Discharge: 2023-01-27 | Disposition: A | Discharge status: Home / Self Care | Payer: BC Managed Care – PPO | Attending: Internal Medicine | Admitting: Internal Medicine

## 2023-01-27 ENCOUNTER — Telehealth: Payer: Self-pay

## 2023-01-27 DIAGNOSIS — I10 Essential (primary) hypertension: Secondary | ICD-10-CM

## 2023-01-27 DIAGNOSIS — J0100 Acute maxillary sinusitis, unspecified: Secondary | ICD-10-CM

## 2023-01-27 DIAGNOSIS — Z760 Encounter for issue of repeat prescription: Secondary | ICD-10-CM | POA: Diagnosis not present

## 2023-01-27 MED ORDER — IPRATROPIUM BROMIDE 0.03 % NA SOLN: 2.0000 | Freq: Two times a day (BID) | NASAL | 3 refills | Status: AC

## 2023-01-27 MED ORDER — VALSARTAN-HYDROCHLOROTHIAZIDE 160-25 MG PO TABS: 1.0000 | ORAL_TABLET | Freq: Every day | ORAL | 0 refills | Status: DC

## 2023-01-27 MED ORDER — AMOXICILLIN-POT CLAVULANATE 875-125 MG PO TABS: 1.0000 | ORAL_TABLET | Freq: Two times a day (BID) | ORAL | 0 refills | Status: DC

## 2023-01-27 MED ORDER — PREDNISONE 20 MG PO TABS: 40.0000 mg | ORAL_TABLET | Freq: Every day | ORAL | 0 refills | Status: DC

## 2023-01-27 MED ORDER — AMLODIPINE BESYLATE 10 MG PO TABS: 10.0000 mg | ORAL_TABLET | Freq: Every day | ORAL | 0 refills | Status: DC

## 2023-01-27 MED ORDER — IPRATROPIUM BROMIDE 0.03 % NA SOLN: 2.0000 | Freq: Two times a day (BID) | NASAL | 3 refills | Status: DC

## 2023-01-27 MED ORDER — PREDNISONE 20 MG PO TABS: 40.0000 mg | ORAL_TABLET | Freq: Every day | ORAL | 0 refills | Status: AC

## 2023-01-27 MED ORDER — AMOXICILLIN-POT CLAVULANATE 875-125 MG PO TABS: 1.0000 | ORAL_TABLET | Freq: Two times a day (BID) | ORAL | 0 refills | Status: AC

## 2023-01-27 NOTE — ED Provider Notes (Signed)
[facility]    CSN: [id number] Arrival date & time: 01/27/23  1032      History   Chief Complaint Chief Complaint  Patient presents with   Hypertension    HPI Russell English is a 57 y.o. male presents for sinus congestion/pain and medication refill.  Patient reports 2 to 3 weeks of sinus pressure/pain with congestion and postnasal drip.  Denies fevers, cough, ear pain, sore throat, body aches, shortness of breath.  No asthma history.  Is a current smoker.  Has not taken any OTC medications for the symptoms.  In addition he is requesting a refill on his amlodipine and valsartan-hydrochlorothiazide.  He does not have a PCP.  No other concerns at this time.   Hypertension    Past Medical History:  Diagnosis Date   Hypertension     Patient Active Problem List   Diagnosis Date Noted   Acute upper respiratory infection 03/13/2017   Cough 03/13/2017   Essential hypertension 03/13/2017    History reviewed. No pertinent surgical history.     Home Medications    Prior to Admission medications   Medication Sig Start Date End Date Taking? Authorizing Provider  amLODipine (NORVASC) 10 MG tablet Take 1 tablet (10 mg total) by mouth daily. 01/27/23   Radford Pax, NP  amoxicillin-clavulanate (AUGMENTIN) 875-125 MG tablet Take 1 tablet by mouth every 12 (twelve) hours. 01/27/23   Radford Pax, NP  atorvastatin (LIPITOR) 20 MG tablet Take 1 tablet (20 mg total) by mouth daily. 07/17/19   Janeece Agee, NP  ibuprofen (ADVIL) 800 MG tablet Take 1 tablet (800 mg total) by mouth every 8 (eight) hours as needed. 05/06/19   Doristine Bosworth, MD  ipratropium (ATROVENT) 0.03 % nasal spray Place 2 sprays into both nostrils 2 (two) times daily. 01/27/23   Radford Pax, NP  predniSONE (DELTASONE) 20 MG tablet Take 2 tablets (40 mg total) by mouth daily with breakfast for 5 days. 01/27/23 02/01/23  Radford Pax, NP  valsartan-hydrochlorothiazide (DIOVAN-HCT) 160-25 MG tablet Take 1  tablet by mouth daily. 01/27/23   Radford Pax, NP    Family History History reviewed. No pertinent family history.  Social History Social History   Tobacco Use   Smoking status: Every Day    Current packs/day: 0.25    Types: Cigarettes   Smokeless tobacco: Never  Vaping Use   Vaping status: Never Used  Substance Use Topics   Alcohol use: Yes    Alcohol/week: 0.0 standard drinks of alcohol    Comment: occ   Drug use: No     Allergies   Patient has no known allergies.   Review of Systems Review of Systems  HENT:  Positive for congestion, postnasal drip, sinus pressure and sinus pain.      Physical Exam Triage Vital Signs ED Triage Vitals  Encounter Vitals Group     BP 01/27/23 1130 133/82     Systolic BP Percentile --      Diastolic BP Percentile --      Pulse Rate 01/27/23 1130 60     Resp 01/27/23 1130 16     Temp 01/27/23 1130 97.8 F (36.6 C)     Temp Source 01/27/23 1130 Oral     SpO2 01/27/23 1130 97 %     Weight --      Height --      Head Circumference --      Peak Flow --  Pain Score 01/27/23 1137 0     Pain Loc --      Pain Education --      Exclude from Growth Chart --    No data found.  Updated Vital Signs BP 133/82 (BP Location: Right Arm)   Pulse 60   Temp 97.8 F (36.6 C) (Oral)   Resp 16   SpO2 97%   Visual Acuity Right Eye Distance:   Left Eye Distance:   Bilateral Distance:    Right Eye Near:   Left Eye Near:    Bilateral Near:     Physical Exam Vitals and nursing note reviewed.  Constitutional:      General: He is not in acute distress.    Appearance: Normal appearance. He is not ill-appearing or toxic-appearing.  HENT:     Head: Normocephalic and atraumatic.     Right Ear: Tympanic membrane and ear canal normal.     Left Ear: Tympanic membrane and ear canal normal.     Nose: Congestion present.     Right Turbinates: Swollen and pale.     Left Turbinates: Swollen and pale.     Right Sinus: Maxillary sinus  tenderness present.     Left Sinus: Maxillary sinus tenderness present.     Mouth/Throat:     Mouth: Mucous membranes are moist.     Pharynx: No posterior oropharyngeal erythema.  Eyes:     Pupils: Pupils are equal, round, and reactive to light.  Cardiovascular:     Rate and Rhythm: Normal rate and regular rhythm.     Heart sounds: Normal heart sounds.  Pulmonary:     Effort: Pulmonary effort is normal.     Breath sounds: Normal breath sounds.  Musculoskeletal:     Cervical back: Normal range of motion and neck supple.  Lymphadenopathy:     Cervical: No cervical adenopathy.  Skin:    General: Skin is warm and dry.  Neurological:     General: No focal deficit present.     Mental Status: He is alert and oriented to person, place, and time.  Psychiatric:        Mood and Affect: Mood normal.        Behavior: Behavior normal.      UC Treatments / Results  Labs (all labs ordered are listed, but only abnormal results are displayed) Labs Reviewed - No data to display  EKG   Radiology No results found.  Procedures Procedures (including critical care time)  Medications Ordered in UC Medications - No data to display  Initial Impression / Assessment and Plan / UC Course  I have reviewed the triage vital signs and the nursing notes.  Pertinent labs & imaging results that were available during my care of the patient were reviewed by me and considered in my medical decision making (see chart for details).     Reviewed exam and symptoms with patient.  No red flags.  Given length of symptoms we will start Augmentin and prednisone.  I also refilled his nasal spray.  Discussed with patient he will need a PCP for additional refills/monitoring.  I did refill his 2 blood pressure medications today.  He states his insurance only pay for 90-day refills which were provided.  Offered to have patient set up with a PCP appointment while in clinic but he declined stating his wife will call to  make an appointment.  Again I have encouraged/reinforced the need for PCP for additional refills as well as monitoring/blood  work and patient verbalized understanding.  Strict ER precautions reviewed and patient verbalized understanding. Final Clinical Impressions(s) / UC Diagnoses   Final diagnoses:  Acute maxillary sinusitis, recurrence not specified  Hypertension, unspecified type  Encounter for medication refill     Discharge Instructions      Start Augmentin twice daily for 7 days.  Prednisone daily for 5 days.  I have refilled your amlodipine and valsartan does hydrochlorothiazide for your blood pressure.  Also refilled your nasal spray Atrovent.  Lots of rest and fluids.  Please follow-up with your PCP if your symptoms do not improve.  Please go to the ER for any worsening symptoms.  I hope you feel better soon!    ED Prescriptions     Medication Sig Dispense Auth. Provider   amLODipine (NORVASC) 10 MG tablet Take 1 tablet (10 mg total) by mouth daily. 90 tablet Radford Pax, NP   valsartan-hydrochlorothiazide (DIOVAN-HCT) 160-25 MG tablet Take 1 tablet by mouth daily. 90 tablet Cheri Rous R, NP   ipratropium (ATROVENT) 0.03 % nasal spray Place 2 sprays into both nostrils 2 (two) times daily. 90 mL Radford Pax, NP   amoxicillin-clavulanate (AUGMENTIN) 875-125 MG tablet Take 1 tablet by mouth every 12 (twelve) hours. 14 tablet Radford Pax, NP   predniSONE (DELTASONE) 20 MG tablet Take 2 tablets (40 mg total) by mouth daily with breakfast for 5 days. 10 tablet Radford Pax, NP      PDMP not reviewed this encounter.   Radford Pax, NP 01/27/23 1220

## 2023-01-27 NOTE — Discharge Instructions (Signed)
Start Augmentin twice daily for 7 days.  Prednisone daily for 5 days.  I have refilled your amlodipine and valsartan does hydrochlorothiazide for your blood pressure.  Also refilled your nasal spray Atrovent.  Lots of rest and fluids.  Please follow-up with your PCP if your symptoms do not improve.  Please go to the ER for any worsening symptoms.  I hope you feel better soon!

## 2023-01-27 NOTE — ED Triage Notes (Signed)
Pt presents to UC stating he "ran out of my 2 blood pressure medications." Pt reports slight nasal congestion.

## 2023-01-27 NOTE — Telephone Encounter (Signed)
Pharmacy change

## 2023-05-12 ENCOUNTER — Telehealth: Payer: BC Managed Care – PPO | Admitting: Physician Assistant

## 2023-05-12 ENCOUNTER — Ambulatory Visit: Payer: Self-pay

## 2023-05-12 DIAGNOSIS — Z760 Encounter for issue of repeat prescription: Secondary | ICD-10-CM

## 2023-05-12 MED ORDER — VALSARTAN-HYDROCHLOROTHIAZIDE 160-25 MG PO TABS: 1.0000 | ORAL_TABLET | Freq: Every day | ORAL | 3 refills | Status: AC

## 2023-05-12 MED ORDER — AMLODIPINE BESYLATE 10 MG PO TABS: 10.0000 mg | ORAL_TABLET | Freq: Every day | ORAL | 0 refills | Status: AC

## 2023-05-12 NOTE — Patient Instructions (Signed)
  Lacie Scotts, thank you for joining Tylene Fantasia Ward, PA-C for today's virtual visit.  While this provider is not your primary care provider (PCP), if your PCP is located in our provider database this encounter information will be shared with them immediately following your visit.   A [facility] MyChart account gives you access to today's visit and all your visits, tests, and labs performed at [facility] " click here if you don't have a [facility] MyChart account or go to [email or web address]  Consent: (Patient) Russell English provided verbal consent for this virtual visit at the beginning of the encounter.  Current Medications:  Current Outpatient Medications:    amLODipine (NORVASC) 10 MG tablet, Take 1 tablet (10 mg total) by mouth daily., Disp: 90 tablet, Rfl: 0   valsartan-hydrochlorothiazide (DIOVAN-HCT) 160-25 MG tablet, Take 1 tablet by mouth daily., Disp: 90 tablet, Rfl: 3   amoxicillin-clavulanate (AUGMENTIN) 875-125 MG tablet, Take 1 tablet by mouth every 12 (twelve) hours., Disp: 14 tablet, Rfl: 0   atorvastatin (LIPITOR) 20 MG tablet, Take 1 tablet (20 mg total) by mouth daily., Disp: 90 tablet, Rfl: 3   ibuprofen (ADVIL) 800 MG tablet, Take 1 tablet (800 mg total) by mouth every 8 (eight) hours as needed., Disp: 90 tablet, Rfl: 3   ipratropium (ATROVENT) 0.03 % nasal spray, Place 2 sprays into both nostrils 2 (two) times daily., Disp: 90 mL, Rfl: 3   Medications ordered in this encounter:  Meds ordered this encounter  Medications   amLODipine (NORVASC) 10 MG tablet    Sig: Take 1 tablet (10 mg total) by mouth daily.    Dispense:  90 tablet    Refill:  0    Supervising Provider:   Merrilee Jansky [[id number]]   valsartan-hydrochlorothiazide (DIOVAN-HCT) 160-25 MG tablet    Sig: Take 1 tablet by mouth daily.    Dispense:  90 tablet    Refill:  3    Supervising Provider:   Merrilee Jansky [phone]     *If you need refills on other medications prior to your  next appointment, please contact your pharmacy*  Follow-Up: Call back or seek an in-person evaluation if the symptoms worsen or if the condition fails to improve as anticipated.  [facility] [phone]  Other Instructions Medication refills prescribed.  Please follow up with your Primary Care Physician for further refills.    If you have been instructed to have an in-person evaluation today at a local Urgent Care facility, please use the link below. It will take you to a list of all of our available [facility], including address, phone number and hours of operation. Please do not delay care.  [facility]  If you or a family member do not have a primary care provider, use the link below to schedule a visit and establish care. When you choose a [facility] primary care physician or advanced practice provider, you gain a long-term partner in health. Find a Primary Care Provider  Learn more about [facility]'s in-office and virtual care options: [facility] - Get Care Now

## 2023-05-12 NOTE — Progress Notes (Signed)
Virtual Visit Consent   Russell English, you are scheduled for a virtual visit with a [facility] provider today. Just as with appointments in the office, your consent must be obtained to participate. Your consent will be active for this visit and any virtual visit you may have with one of our providers in the next 365 days. If you have a MyChart account, a copy of this consent can be sent to you electronically.  As this is a virtual visit, video technology does not allow for your provider to perform a traditional examination. This may limit your provider's ability to fully assess your condition. If your provider identifies any concerns that need to be evaluated in person or the need to arrange testing (such as labs, EKG, etc.), we will make arrangements to do so. Although advances in technology are sophisticated, we cannot ensure that it will always work on either your end or our end. If the connection with a video visit is poor, the visit may have to be switched to a telephone visit. With either a video or telephone visit, we are not always able to ensure that we have a secure connection.  By engaging in this virtual visit, you consent to the provision of healthcare and authorize for your insurance to be billed (if applicable) for the services provided during this visit. Depending on your insurance coverage, you may receive a charge related to this service.  I need to obtain your verbal consent now. Are you willing to proceed with your visit today? [name] has provided verbal consent on 05/12/2023 for a virtual visit (video or telephone). Tylene Fantasia Ward, PA-C  Date: 05/12/2023 11:39 AM  Virtual Visit via Video Note   I, Tylene Fantasia Ward, connected with  Russell English  ([id number], 1965/08/20) on 05/12/23 at 11:30 AM EST by a video-enabled telemedicine application and verified that I am speaking with the correct person using two identifiers.  Location: Patient: Virtual Visit Location Patient:  Home Provider: Virtual Visit Location Provider: Home Office   I discussed the limitations of evaluation and management by telemedicine and the availability of in person appointments. The patient expressed understanding and agreed to proceed.    History of Present Illness: Russell English is a 58 y.o. who identifies as a male who was assigned male at birth, and is being seen today for medication refills. Pt reports he has been out of HTN medications for about two days.  He reports pressures at home have been normal.  Denies headache, visual changes.  HPI: HPI  Problems:  Patient Active Problem List   Diagnosis Date Noted   Acute upper respiratory infection 03/13/2017   Cough 03/13/2017   Essential hypertension 03/13/2017    Allergies: No Known Allergies Medications:  Current Outpatient Medications:    amLODipine (NORVASC) 10 MG tablet, Take 1 tablet (10 mg total) by mouth daily., Disp: 90 tablet, Rfl: 0   valsartan-hydrochlorothiazide (DIOVAN-HCT) 160-25 MG tablet, Take 1 tablet by mouth daily., Disp: 90 tablet, Rfl: 3   amoxicillin-clavulanate (AUGMENTIN) 875-125 MG tablet, Take 1 tablet by mouth every 12 (twelve) hours., Disp: 14 tablet, Rfl: 0   atorvastatin (LIPITOR) 20 MG tablet, Take 1 tablet (20 mg total) by mouth daily., Disp: 90 tablet, Rfl: 3   ibuprofen (ADVIL) 800 MG tablet, Take 1 tablet (800 mg total) by mouth every 8 (eight) hours as needed., Disp: 90 tablet, Rfl: 3   ipratropium (ATROVENT) 0.03 % nasal spray, Place 2 sprays into both nostrils 2 (two) times  daily., Disp: 90 mL, Rfl: 3  Observations/Objective: Patient is well-developed, well-nourished in no acute distress.  Resting comfortably at home.  Head is normocephalic, atraumatic.  No labored breathing.  Speech is clear and coherent with logical content.  Patient is alert and oriented at baseline.    Assessment and Plan: 1. Medication refill (Primary)  Will provide refills today.  He reports insurance covers meds  at a lower price if 90 day supply prescribed.  Advised need for PCP follow up for further refills.   Follow Up Instructions: I discussed the assessment and treatment plan with the patient. The patient was provided an opportunity to ask questions and all were answered. The patient agreed with the plan and demonstrated an understanding of the instructions.  A copy of instructions were sent to the patient via MyChart unless otherwise noted below.     The patient was advised to call back or seek an in-person evaluation if the symptoms worsen or if the condition fails to improve as anticipated.    Tylene Fantasia Ward, PA-C

## 2023-05-13 ENCOUNTER — Ambulatory Visit: Payer: Self-pay
